# Patient Record
Sex: Female | Born: 1975 | Race: White | Hispanic: No | Marital: Single | Smoking: Former smoker
Health system: Southern US, Community
[De-identification: ages and names within clinical notes are randomized; demographics above are authoritative.]

## PROBLEM LIST (undated history)

## (undated) DIAGNOSIS — Z720 Tobacco use: Secondary | ICD-10-CM

## (undated) DIAGNOSIS — F32A Depression, unspecified: Secondary | ICD-10-CM

## (undated) DIAGNOSIS — J45909 Unspecified asthma, uncomplicated: Secondary | ICD-10-CM

## (undated) DIAGNOSIS — F329 Major depressive disorder, single episode, unspecified: Secondary | ICD-10-CM

## (undated) DIAGNOSIS — F419 Anxiety disorder, unspecified: Secondary | ICD-10-CM

## (undated) DIAGNOSIS — F1110 Opioid abuse, uncomplicated: Secondary | ICD-10-CM

## (undated) HISTORY — PX: TONSILLECTOMY: SUR1361

---

## 2005-01-26 ENCOUNTER — Emergency Department (HOSPITAL_COMMUNITY): Admission: EM | Admit: 2005-01-26 | Discharge: 2005-01-26 | Payer: Self-pay | Admitting: Emergency Medicine

## 2007-07-08 ENCOUNTER — Ambulatory Visit: Payer: Self-pay | Admitting: Internal Medicine

## 2007-07-10 ENCOUNTER — Ambulatory Visit: Payer: Self-pay | Admitting: *Deleted

## 2007-07-15 ENCOUNTER — Ambulatory Visit: Payer: Self-pay | Admitting: Internal Medicine

## 2007-09-16 ENCOUNTER — Encounter: Payer: Self-pay | Admitting: Family Medicine

## 2007-09-16 ENCOUNTER — Ambulatory Visit: Payer: Self-pay | Admitting: Family Medicine

## 2007-09-16 LAB — CONVERTED CEMR LAB
ALT: 238 units/L — ABNORMAL HIGH (ref 0–35)
BUN: 18 mg/dL (ref 6–23)
Basophils Absolute: 0.1 10*3/uL (ref 0.0–0.1)
Basophils Relative: 1 % (ref 0–1)
Benzodiazepines.: NEGATIVE
CO2: 23 meq/L (ref 19–32)
Calcium: 9.3 mg/dL (ref 8.4–10.5)
Cocaine Metabolites: NEGATIVE
Creatinine, Ser: 1.07 mg/dL (ref 0.40–1.20)
Creatinine,U: 148.5 mg/dL
Eosinophils Relative: 2 % (ref 0–5)
HCT: 42.5 % (ref 36.0–46.0)
Hemoglobin: 13.6 g/dL (ref 12.0–15.0)
MCHC: 32 g/dL (ref 30.0–36.0)
Monocytes Absolute: 0.7 10*3/uL (ref 0.1–1.0)
Monocytes Relative: 11 % (ref 3–12)
Opiate Screen, Urine: NEGATIVE
Phencyclidine (PCP): NEGATIVE
Propoxyphene: NEGATIVE
RBC: 4.57 M/uL (ref 3.87–5.11)
RDW: 14.6 % (ref 11.5–15.5)
TSH: 1.062 microintl units/mL (ref 0.350–5.50)
Total Bilirubin: 0.4 mg/dL (ref 0.3–1.2)

## 2007-09-30 ENCOUNTER — Ambulatory Visit: Payer: Self-pay | Admitting: Family Medicine

## 2007-09-30 LAB — CONVERTED CEMR LAB
ALT: 172 units/L — ABNORMAL HIGH (ref 0–35)
AST: 93 units/L — ABNORMAL HIGH (ref 0–37)
Albumin: 4.5 g/dL (ref 3.5–5.2)
Alkaline Phosphatase: 64 units/L (ref 39–117)
BUN: 16 mg/dL (ref 6–23)
Basophils Absolute: 0 10*3/uL (ref 0.0–0.1)
Basophils Relative: 1 % (ref 0–1)
Benzodiazepines.: POSITIVE — AB
Calcium: 9.3 mg/dL (ref 8.4–10.5)
Chloride: 106 meq/L (ref 96–112)
Creatinine,U: 202.6 mg/dL
Eosinophils Absolute: 0.2 10*3/uL (ref 0.0–0.7)
HCV Ab: REACTIVE — AB
HDL: 51 mg/dL (ref >39)
Hep A Total Ab: NEGATIVE
Hep B Core Total Ab: NEGATIVE
Hep B S Ab: POSITIVE — AB
Hepatitis B Surface Ag: NEGATIVE
LDL Cholesterol: 72 mg/dL (ref 0–99)
MCHC: 33.7 g/dL (ref 30.0–36.0)
MCV: 91 fL (ref 78.0–100.0)
Marijuana Metabolite: POSITIVE — AB
Methadone: NEGATIVE
Monocytes Relative: 9 % (ref 3–12)
Neutro Abs: 3.5 10*3/uL (ref 1.7–7.7)
Neutrophils Relative %: 58 % (ref 43–77)
Opiate Screen, Urine: NEGATIVE
Platelets: 228 10*3/uL (ref 150–400)
Potassium: 4.8 meq/L (ref 3.5–5.3)
Propoxyphene: POSITIVE — AB
RBC: 4.54 M/uL (ref 3.87–5.11)
RDW: 14.1 % (ref 11.5–15.5)
Sodium: 138 meq/L (ref 135–145)
TSH: 0.296 microintl units/mL — ABNORMAL LOW (ref 0.350–5.50)

## 2007-10-14 ENCOUNTER — Encounter: Payer: Self-pay | Admitting: Family Medicine

## 2007-10-14 ENCOUNTER — Ambulatory Visit: Payer: Self-pay | Admitting: Internal Medicine

## 2007-10-14 LAB — CONVERTED CEMR LAB: INR: 1.1 (ref 0.0–1.5)

## 2007-11-19 ENCOUNTER — Ambulatory Visit: Payer: Self-pay | Admitting: Internal Medicine

## 2012-10-12 ENCOUNTER — Ambulatory Visit: Payer: Medicaid Other | Admitting: Physical Therapy

## 2012-10-19 ENCOUNTER — Ambulatory Visit: Payer: Medicaid Other | Admitting: Physical Therapy

## 2012-10-20 ENCOUNTER — Ambulatory Visit: Payer: Medicaid Other | Attending: Internal Medicine | Admitting: Physical Therapy

## 2012-10-20 DIAGNOSIS — M542 Cervicalgia: Secondary | ICD-10-CM | POA: Insufficient documentation

## 2012-10-20 DIAGNOSIS — M5430 Sciatica, unspecified side: Secondary | ICD-10-CM | POA: Insufficient documentation

## 2012-10-20 DIAGNOSIS — IMO0001 Reserved for inherently not codable concepts without codable children: Secondary | ICD-10-CM | POA: Insufficient documentation

## 2012-11-06 ENCOUNTER — Emergency Department (HOSPITAL_COMMUNITY)
Admission: EM | Admit: 2012-11-06 | Discharge: 2012-11-06 | Disposition: A | Discharge status: Home / Self Care | Payer: Medicaid Other | Attending: Emergency Medicine | Admitting: Emergency Medicine

## 2012-11-06 ENCOUNTER — Encounter (HOSPITAL_COMMUNITY): Payer: Self-pay | Admitting: Emergency Medicine

## 2012-11-06 DIAGNOSIS — Y939 Activity, unspecified: Secondary | ICD-10-CM | POA: Insufficient documentation

## 2012-11-06 DIAGNOSIS — R450 Nervousness: Secondary | ICD-10-CM | POA: Insufficient documentation

## 2012-11-06 DIAGNOSIS — F411 Generalized anxiety disorder: Secondary | ICD-10-CM | POA: Insufficient documentation

## 2012-11-06 DIAGNOSIS — Z3202 Encounter for pregnancy test, result negative: Secondary | ICD-10-CM | POA: Insufficient documentation

## 2012-11-06 DIAGNOSIS — F17200 Nicotine dependence, unspecified, uncomplicated: Secondary | ICD-10-CM | POA: Insufficient documentation

## 2012-11-06 DIAGNOSIS — T401X4A Poisoning by heroin, undetermined, initial encounter: Secondary | ICD-10-CM | POA: Insufficient documentation

## 2012-11-06 DIAGNOSIS — R55 Syncope and collapse: Secondary | ICD-10-CM | POA: Insufficient documentation

## 2012-11-06 DIAGNOSIS — Y929 Unspecified place or not applicable: Secondary | ICD-10-CM | POA: Insufficient documentation

## 2012-11-06 LAB — COMPREHENSIVE METABOLIC PANEL
AST: 22 U/L (ref 0–37)
Albumin: 3.5 g/dL (ref 3.5–5.2)
Alkaline Phosphatase: 53 U/L (ref 39–117)
Chloride: 106 mEq/L (ref 96–112)
Potassium: 4.2 mEq/L (ref 3.5–5.1)
Sodium: 139 mEq/L (ref 135–145)
Total Bilirubin: 0.1 mg/dL — ABNORMAL LOW (ref 0.3–1.2)

## 2012-11-06 LAB — URINE MICROSCOPIC-ADD ON

## 2012-11-06 LAB — RAPID URINE DRUG SCREEN, HOSP PERFORMED
Barbiturates: NOT DETECTED
Benzodiazepines: POSITIVE — AB
Cocaine: NOT DETECTED
Opiates: POSITIVE — AB

## 2012-11-06 LAB — CBC WITH DIFFERENTIAL/PLATELET
Basophils Absolute: 0 10*3/uL (ref 0.0–0.1)
Basophils Relative: 0 % (ref 0–1)
Hemoglobin: 12.2 g/dL (ref 12.0–15.0)
MCHC: 33.2 g/dL (ref 30.0–36.0)
Neutro Abs: 6.2 10*3/uL (ref 1.7–7.7)
Neutrophils Relative %: 75 % (ref 43–77)
RDW: 13.1 % (ref 11.5–15.5)

## 2012-11-06 LAB — URINALYSIS, ROUTINE W REFLEX MICROSCOPIC
Glucose, UA: NEGATIVE mg/dL
Ketones, ur: NEGATIVE mg/dL
Protein, ur: NEGATIVE mg/dL

## 2012-11-06 LAB — PREGNANCY, URINE: Preg Test, Ur: NEGATIVE

## 2012-11-06 MED ORDER — IBUPROFEN 800 MG PO TABS
800.0000 mg | ORAL_TABLET | Freq: Once | ORAL | Status: AC
  Administered 2012-11-06: 800 mg via ORAL
  Filled 2012-11-06: qty 1

## 2012-11-06 MED ORDER — ONDANSETRON HCL 4 MG/2ML IJ SOLN
INTRAMUSCULAR | Status: AC
  Administered 2012-11-06: 4 mg via INTRAVENOUS
  Filled 2012-11-06: qty 2

## 2012-11-06 MED ORDER — ONDANSETRON HCL 4 MG/2ML IJ SOLN: 4.0000 mg | Freq: Once | INTRAMUSCULAR | Status: AC

## 2012-11-06 NOTE — ED Notes (Signed)
Per EMS, heroin OD, brother was at the house and started treatment with Narcan 1 mg IN-brother is an EMS worker-when EMS arrived, she was apneic when they arrived-was given Narcan 1 mg IV-pt's breathing returned to normal-per pt, did some heroin yesterday and "it knocked her out", thought it was laced with something-did smaller dose this am-states she has been clean for a few months, recent death in the family "made her use again"

## 2012-11-06 NOTE — Progress Notes (Signed)
Patient reports her pcp is Dr. Concepcion Elk.

## 2012-11-06 NOTE — ED Notes (Signed)
Per EMS and pt.  Pt began using heroin yesterday after being clean for several months.  Said she knocked herself out yesterday, used less today, but brother found pt unconscious.  EMS arrived gave narcan.  Pt awake and oriented upon arrival.  States she had recent death in family and took heroin to not feel the pain. Denies si/hi.

## 2012-11-06 NOTE — ED Notes (Signed)
UEA:[id number]<WJ> Expected date:<BR> Expected time:<BR> Means of arrival:<BR> Comments:<BR> EMS-heroin OD

## 2012-11-06 NOTE — ED Provider Notes (Signed)
History    CSN: [id number] Arrival date & time 11/06/12  4098  First MD Initiated Contact with Patient 11/06/12 [phone]     Chief Complaint  Patient presents with  . 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 Was given several doses of Narcan By EMS with return of spontaneous breathing and increased alertness. Pt is tearful in room and states that she was taking heroin recreationally and unintentionally overdosed. No SI, HI. Denies coingestants. Pt states she does not want drug rehab because she needs to care for her daughter. Spoke with pt's brother who was at scene. Stated he does not believe this was suicide attempt.  History reviewed. No pertinent past medical history. History reviewed. No pertinent past surgical history. History reviewed. No pertinent family history. History  Substance Use Topics  . Smoking status: Current Every Day Smoker  . Smokeless tobacco: Not on file  . Alcohol Use: Yes   OB History   Grav Para Term Preterm Abortions TAB SAB Ect Mult Living                 Review of Systems  Constitutional: Negative for fever and chills.  Respiratory: Negative for shortness of breath.   Cardiovascular: Negative for chest pain.  Gastrointestinal: Negative for nausea, vomiting and abdominal pain.  Musculoskeletal: Negative for myalgias and back pain.  Skin: Negative for rash and wound.  Neurological: Positive for syncope. Negative for dizziness, weakness, light-headedness, numbness and headaches.  Psychiatric/Behavioral: Negative for suicidal ideas. The patient is nervous/anxious.   All other systems reviewed and are negative.    Allergies  Review of patient's allergies indicates no known allergies.  Home Medications   Current Outpatient Rx  Name  Route   Sig  Dispense  Refill  . diclofenac (VOLTAREN) 75 MG EC tablet   Oral   Take 75 mg by mouth 2 (two) times daily as needed (for inflammation).         Marland Kitchen tiZANidine (ZANAFLEX) 4 MG tablet   Oral   Take 4 mg by mouth every 6 (six) hours as needed (for muscle spasms).          BP 105/74  Pulse 75  Temp(Src) 97.9 F (36.6 C) (Oral)  Resp 16  SpO2 96% Physical Exam  Nursing note and vitals reviewed. Constitutional: She is oriented to person, place, and time. She appears well-developed and well-nourished. No distress.  tearful  HENT:  Head: Normocephalic and atraumatic.  Mouth/Throat: Oropharynx is clear and moist.  Eyes: EOM are normal. Pupils are equal, round, and reactive to light.  Neck: Normal range of motion. Neck supple.  Cardiovascular: Normal rate and regular rhythm.   Pulmonary/Chest: Effort normal and breath sounds normal. No respiratory distress. She has no wheezes. She has no rales.  Abdominal: Soft. Bowel sounds are normal. She exhibits no distension and no mass. There is no tenderness. There is no rebound and no guarding.  Musculoskeletal: Normal range of motion. She exhibits no edema and no tenderness.  Neurological: She is alert and oriented to person, place, and time.  5/5 motor in all ext, sensation intact  Skin: Skin is warm and dry. No rash noted. No erythema.    ED Course  Procedures (including critical care time) Labs Reviewed  COMPREHENSIVE METABOLIC PANEL - Abnormal; Notable for the following:    Glucose, Bld  102 (*)    BUN 25 (*)    Total Bilirubin 0.1 (*)    GFR calc non Af Amer 72 (*)    GFR calc Af Amer 83 (*)    All other components within normal limits  URINALYSIS, ROUTINE W REFLEX MICROSCOPIC - Abnormal; Notable for the following:    Hgb urine dipstick TRACE (*)    All other components within normal limits  URINE RAPID DRUG SCREEN (HOSP PERFORMED) - Abnormal; Notable for the following:    Opiates POSITIVE (*)    Benzodiazepines POSITIVE  (*)    All other components within normal limits  SALICYLATE LEVEL - Abnormal; Notable for the following:    Salicylate Lvl <2.0 (*)    All other components within normal limits  CBC WITH DIFFERENTIAL  PREGNANCY, URINE  ETHANOL  ACETAMINOPHEN LEVEL  URINE MICROSCOPIC-ADD ON   No results found. 1. Heroin overdose, initial encounter      Date: 11/06/2012  Rate: 95  Rhythm: normal sinus rhythm  QRS Axis: normal  Intervals: normal  ST/T Wave abnormalities: normal  Conduction Disutrbances:none  Narrative Interpretation:   Old EKG Reviewed: none available   MDM  Will observe in ED to assure Narcan does not need to be redosed. Once medically cleared will have ACT see for outpatient resources Remains stable in ED after 3 hours of observation. Will d/c home.  Loren Racer, MD 11/06/12 1416

## 2013-03-25 ENCOUNTER — Other Ambulatory Visit: Payer: Self-pay | Admitting: Internal Medicine

## 2013-03-25 DIAGNOSIS — R55 Syncope and collapse: Secondary | ICD-10-CM

## 2013-03-29 ENCOUNTER — Other Ambulatory Visit: Payer: Medicaid Other

## 2013-04-03 ENCOUNTER — Emergency Department (HOSPITAL_COMMUNITY): Payer: Medicaid Other

## 2013-04-03 ENCOUNTER — Encounter (HOSPITAL_COMMUNITY): Payer: Self-pay | Admitting: Emergency Medicine

## 2013-04-03 ENCOUNTER — Inpatient Hospital Stay (HOSPITAL_COMMUNITY)
Admission: EM | Admit: 2013-04-03 | Discharge: 2013-04-05 | DRG: 193 | Disposition: A | Discharge status: Home / Self Care | Payer: Medicaid Other | Attending: Family Medicine | Admitting: Family Medicine

## 2013-04-03 DIAGNOSIS — J9601 Acute respiratory failure with hypoxia: Secondary | ICD-10-CM | POA: Diagnosis present

## 2013-04-03 DIAGNOSIS — D649 Anemia, unspecified: Secondary | ICD-10-CM | POA: Diagnosis present

## 2013-04-03 DIAGNOSIS — R55 Syncope and collapse: Secondary | ICD-10-CM | POA: Diagnosis present

## 2013-04-03 DIAGNOSIS — Z825 Family history of asthma and other chronic lower respiratory diseases: Secondary | ICD-10-CM

## 2013-04-03 DIAGNOSIS — F410 Panic disorder [episodic paroxysmal anxiety] without agoraphobia: Secondary | ICD-10-CM | POA: Diagnosis present

## 2013-04-03 DIAGNOSIS — J45909 Unspecified asthma, uncomplicated: Secondary | ICD-10-CM | POA: Diagnosis present

## 2013-04-03 DIAGNOSIS — F319 Bipolar disorder, unspecified: Secondary | ICD-10-CM | POA: Diagnosis present

## 2013-04-03 DIAGNOSIS — B1920 Unspecified viral hepatitis C without hepatic coma: Secondary | ICD-10-CM

## 2013-04-03 DIAGNOSIS — F411 Generalized anxiety disorder: Secondary | ICD-10-CM | POA: Diagnosis present

## 2013-04-03 DIAGNOSIS — J9600 Acute respiratory failure, unspecified whether with hypoxia or hypercapnia: Secondary | ICD-10-CM | POA: Diagnosis present

## 2013-04-03 DIAGNOSIS — R0902 Hypoxemia: Secondary | ICD-10-CM

## 2013-04-03 DIAGNOSIS — J189 Pneumonia, unspecified organism: Principal | ICD-10-CM | POA: Diagnosis present

## 2013-04-03 DIAGNOSIS — F1910 Other psychoactive substance abuse, uncomplicated: Secondary | ICD-10-CM | POA: Diagnosis present

## 2013-04-03 DIAGNOSIS — Z79899 Other long term (current) drug therapy: Secondary | ICD-10-CM

## 2013-04-03 DIAGNOSIS — F17200 Nicotine dependence, unspecified, uncomplicated: Secondary | ICD-10-CM | POA: Diagnosis present

## 2013-04-03 DIAGNOSIS — R4182 Altered mental status, unspecified: Secondary | ICD-10-CM | POA: Diagnosis present

## 2013-04-03 HISTORY — DX: Anxiety disorder, unspecified: F41.9

## 2013-04-03 HISTORY — DX: Unspecified asthma, uncomplicated: J45.909

## 2013-04-03 HISTORY — DX: Depression, unspecified: F32.A

## 2013-04-03 HISTORY — DX: Tobacco use: Z72.0

## 2013-04-03 HISTORY — DX: Opioid abuse, uncomplicated: F11.10

## 2013-04-03 HISTORY — DX: Major depressive disorder, single episode, unspecified: F32.9

## 2013-04-03 LAB — CBC WITH DIFFERENTIAL/PLATELET
Basophils Absolute: 0.1 10*3/uL (ref 0.0–0.1)
Basophils Relative: 1 % (ref 0–1)
Eosinophils Absolute: 0.5 10*3/uL (ref 0.0–0.7)
Eosinophils Relative: 7 % — ABNORMAL HIGH (ref 0–5)
HCT: 35.7 % — ABNORMAL LOW (ref 36.0–46.0)
MCH: 29.1 pg (ref 26.0–34.0)
MCHC: 33.3 g/dL (ref 30.0–36.0)
Monocytes Absolute: 0.6 10*3/uL (ref 0.1–1.0)
Monocytes Relative: 9 % (ref 3–12)
Neutro Abs: 4.2 10*3/uL (ref 1.7–7.7)
Neutrophils Relative %: 64 % (ref 43–77)
RDW: 14 % (ref 11.5–15.5)

## 2013-04-03 LAB — POCT I-STAT TROPONIN I: Troponin i, poc: 0.01 ng/mL (ref 0.00–0.08)

## 2013-04-03 LAB — RAPID URINE DRUG SCREEN, HOSP PERFORMED
Amphetamines: NOT DETECTED
Barbiturates: NOT DETECTED
Tetrahydrocannabinol: NOT DETECTED

## 2013-04-03 LAB — HEPATIC FUNCTION PANEL
ALT: 32 U/L (ref 0–35)
AST: 26 U/L (ref 0–37)
Albumin: 4 g/dL (ref 3.5–5.2)
Alkaline Phosphatase: 59 U/L (ref 39–117)
Bilirubin, Direct: <0.1 mg/dL (ref 0.0–0.3)
Total Bilirubin: 0.2 mg/dL — ABNORMAL LOW (ref 0.3–1.2)

## 2013-04-03 LAB — BLOOD GAS, ARTERIAL
Drawn by: 232811
O2 Content: 3 L/min
O2 Saturation: 93.7 %
Patient temperature: 98.6
pO2, Arterial: 74.7 mmHg — ABNORMAL LOW (ref 80.0–100.0)

## 2013-04-03 LAB — ACETAMINOPHEN LEVEL: Acetaminophen (Tylenol), Serum: <15 ug/mL (ref 10–30)

## 2013-04-03 LAB — BASIC METABOLIC PANEL
BUN: 24 mg/dL — ABNORMAL HIGH (ref 6–23)
CO2: 26 mEq/L (ref 19–32)
Chloride: 102 mEq/L (ref 96–112)
Glucose, Bld: 84 mg/dL (ref 70–99)
Potassium: 4.4 mEq/L (ref 3.5–5.1)

## 2013-04-03 LAB — CARBOXYHEMOGLOBIN: O2 Saturation: 94.3 %

## 2013-04-03 LAB — SALICYLATE LEVEL: Salicylate Lvl: <2 mg/dL — ABNORMAL LOW (ref 2.8–20.0)

## 2013-04-03 MED ORDER — ALBUTEROL SULFATE (5 MG/ML) 0.5% IN NEBU
5.0000 mg | INHALATION_SOLUTION | Freq: Once | RESPIRATORY_TRACT | Status: AC
  Administered 2013-04-03: 5 mg via RESPIRATORY_TRACT
  Filled 2013-04-03: qty 1

## 2013-04-03 MED ORDER — IOHEXOL 350 MG/ML SOLN
100.0000 mL | Freq: Once | INTRAVENOUS | Status: AC | PRN
  Administered 2013-04-03: 100 mL via INTRAVENOUS

## 2013-04-03 MED ORDER — LEVOFLOXACIN IN D5W 750 MG/150ML IV SOLN
750.0000 mg | Freq: Once | INTRAVENOUS | Status: AC
  Administered 2013-04-04: 750 mg via INTRAVENOUS
  Filled 2013-04-03: qty 150

## 2013-04-03 MED ORDER — SODIUM CHLORIDE 0.9 % IV BOLUS (SEPSIS)
1000.0000 mL | Freq: Once | INTRAVENOUS | Status: AC
  Administered 2013-04-03: 1000 mL via INTRAVENOUS

## 2013-04-03 MED ORDER — NALOXONE HCL 0.4 MG/ML IJ SOLN
0.4000 mg | Freq: Once | INTRAMUSCULAR | Status: AC
  Administered 2013-04-03: 0.4 mg via INTRAVENOUS
  Filled 2013-04-03: qty 1

## 2013-04-03 NOTE — ED Notes (Signed)
Dr. Criss Alvine at bedside evaluating pt-O2 sat decreased to 83%-pt is lethargic but conversing WNL's, speech slow-eyes are half closed.  Mother states pt has been like this all day-stimulated pt-O2 applied 5l/[place] with sat increased to 96% and after pt stimulated.  Dr. Criss Alvine aware.  Primary RN, Irving Burton aware.

## 2013-04-03 NOTE — ED Notes (Signed)
Patient with hx of IV drug use, but states that she did not use today Patient recently changed psych meds and since then, she has been more sleepy than usual Patient states that her PCP is aware of low O2 sat and that this is normal for her MD at bedside Patient able to speak in full, complete sentences--able to stay awake during conversation ED tech at bedside to obtain EKG RT at bedside to obtain labs, per MD order

## 2013-04-03 NOTE — ED Notes (Signed)
MD at bedside. 

## 2013-04-03 NOTE — ED Notes (Signed)
Lab unable to run CBC off of previously collected specimen Patient refusing to have labs drawn MD at bedside

## 2013-04-03 NOTE — ED Notes (Signed)
Patient now agrees to have CBC drawn Phlebotomy at bedside Patient more alert, awake after admin of Narcan Patient remains on cardiac monitor and 3L [place] O2 sat 94-96 % on 3L

## 2013-04-03 NOTE — ED Notes (Addendum)
Patient transported to CT dept via stretcher Patient awake, alert and in NAD upon leaving ED for imaging

## 2013-04-03 NOTE — ED Notes (Signed)
She c/o recent insomnia; and weakness "feels like I'm gonna pass out".  She has recently ceased taking Neurontin, and is currently on Zoloft 200mg /day.  She states that earlier today she found herself on the floor "picking up things; and I can't remember how I got there".  Her mom is a Engineer, civil (consulting); and has been checking her with a home pulse oximeter and has found her spo2 to be quite low (70's) at times.  Pt. Is drowsy, but oriented x 4 and in no distress.  She also mentions having some recent discomfort in her chest, but has none now.

## 2013-04-03 NOTE — ED Notes (Signed)
Narcan given, see MAR Patient remains on cardiac monitor with q 15 min BP Family at bedside Side rails up, call bell in reach

## 2013-04-03 NOTE — ED Provider Notes (Signed)
CSN: [id number]     Arrival date & time 04/03/13  1809 History   First MD Initiated Contact with Patient 04/03/13 1945     Chief Complaint  Patient presents with  . Weakness   (Consider location/radiation/quality/duration/timing/severity/associated sxs/prior Treatment) HPI Comments: 37 year old female presents with multiple complaints. She presents today because she had a episode of confusion was transient. She states that she was taking a shower and was brushing her teeth. The next thing she remembers she is picking up here pieces on the floor and thought it was in the morning. At this point it was actually 4 PM in the afternoon. She states she's not falling or passing out. She was never lying on the ground. She states that she's also been treated for a bronchitis over the past couple weeks. Her mom is an Charity fundraiser and states she's been checking her pulse ox at home. Multiple times it sounded down in the low 80s and has tried some of her brothers albuterol which seems to bring up to the 90s. Patient does not complain of specific shortness of breath but states sometimes she "forgets to breathe". She's also had a headache for the past 2 weeks. She states that her PCP knows about these low saturations and ordered an outpatient chest x-ray which the patient is still not gone to get. Patient does admit to IV drug abuse. She states that 3 days ago she injected oxycodone states she has not used any in the last couple days. She states she's been getting hypoxic even on days she is not using drugs.   History reviewed. No pertinent past medical history. History reviewed. No pertinent past surgical history. History reviewed. No pertinent family history. History  Substance Use Topics  . Smoking status: Current Every Day Smoker  . Smokeless tobacco: Not on file  . Alcohol Use: Yes   OB History   Grav Para Term Preterm Abortions TAB SAB Ect Mult Living                 Review of Systems  Constitutional:  Positive for fatigue. Negative for fever.  Respiratory: Positive for cough and shortness of breath.   Cardiovascular: Negative for chest pain and leg swelling.  Gastrointestinal: Negative for vomiting.  Neurological: Positive for weakness and headaches (for 2 weeks).  Psychiatric/Behavioral: Positive for confusion.  All other systems reviewed and are negative.    Allergies  Review of patient's allergies indicates no known allergies.  Home Medications   Current Outpatient Rx  Name  Route  Sig  Dispense  Refill  . albuterol (PROVENTIL HFA;VENTOLIN HFA) 108 (90 BASE) MCG/ACT inhaler   Inhalation   Inhale 2 puffs into the lungs every 6 (six) hours as needed for wheezing or shortness of breath.         . diphenhydrAMINE (BENADRYL) 25 MG tablet   Oral   Take 25 mg by mouth every 6 (six) hours as needed.         . sertraline (ZOLOFT) 100 MG tablet   Oral   Take 100 mg by mouth daily.         Marland Kitchen tiZANidine (ZANAFLEX) 4 MG tablet   Oral   Take 4 mg by mouth every 6 (six) hours as needed (for muscle spasms).         . zolpidem (AMBIEN) 10 MG tablet   Oral   Take 10 mg by mouth at bedtime as needed for sleep.  BP 106/69  Pulse 82  Temp(Src) 98 F (36.7 C) (Oral)  Resp 12  SpO2 97%  LMP 03/24/2013 Physical Exam  Nursing note and vitals reviewed. Constitutional: She is oriented to person, place, and time. She appears well-developed and well-nourished.  HENT:  Head: Normocephalic and atraumatic.  Right Ear: External ear normal.  Left Ear: External ear normal.  Nose: Nose normal.  Eyes: Right eye exhibits no discharge. Left eye exhibits no discharge.  Cardiovascular: Normal rate, regular rhythm and normal heart sounds.   Pulmonary/Chest: Effort normal. She has wheezes (scattered).  Abdominal: Soft. There is no tenderness.  Neurological: She is alert and oriented to person, place, and time.  Sleepy but maintains conversation and stays awake while sleeping,  then nods off  Skin: Skin is warm and dry.    ED Course  Procedures (including critical care time) Labs Review Labs Reviewed  BLOOD GAS, ARTERIAL - Abnormal; Notable for the following:    pH, Arterial 7.307 (*)    pCO2 arterial 51.7 (*)    pO2, Arterial 74.7 (*)    Bicarbonate 25.1 (*)    All other components within normal limits  BASIC METABOLIC PANEL - Abnormal; Notable for the following:    BUN 24 (*)    Creatinine, Ser 1.18 (*)    GFR calc non Af Amer 58 (*)    GFR calc Af Amer 67 (*)    All other components within normal limits  HEPATIC FUNCTION PANEL - Abnormal; Notable for the following:    Total Bilirubin 0.2 (*)    All other components within normal limits  URINE RAPID DRUG SCREEN (HOSP PERFORMED) - Abnormal; Notable for the following:    Opiates POSITIVE (*)    Benzodiazepines POSITIVE (*)    All other components within normal limits  SALICYLATE LEVEL - Abnormal; Notable for the following:    Salicylate Lvl <2.0 (*)    All other components within normal limits  CBC WITH DIFFERENTIAL - Abnormal; Notable for the following:    Hemoglobin 11.9 (*)    HCT 35.7 (*)    Eosinophils Relative 7 (*)    All other components within normal limits  CULTURE, BLOOD (ROUTINE X 2)  CULTURE, BLOOD (ROUTINE X 2)  CARBOXYHEMOGLOBIN  AMMONIA  ETHANOL  ACETAMINOPHEN LEVEL  CBC WITH DIFFERENTIAL  POCT I-STAT TROPONIN I   Imaging Review Dg Chest 2 View  04/03/2013   CLINICAL DATA:  Hypoxia and weakness.  Tremors.  History of smoking.  EXAM: CHEST  2 VIEW  COMPARISON:  None.  FINDINGS: The lungs are well-aerated and clear. There is no evidence of focal opacification, pleural effusion or pneumothorax.  The heart is normal in size; the mediastinal contour is within normal limits. No acute osseous abnormalities are seen.  IMPRESSION: No acute cardiopulmonary process seen.   Electronically Signed   By: Roanna Raider M.D.   On: 04/03/2013 21:13   Ct Head Wo Contrast  04/03/2013    CLINICAL DATA:  Possible overdose. Posterior headache and weakness. Prior fall. Multiple episodes of syncope.  EXAM: CT HEAD WITHOUT CONTRAST  TECHNIQUE: Contiguous axial images were obtained from the base of the skull through the vertex without intravenous contrast.  COMPARISON:  None.  FINDINGS: There is no evidence of acute infarction, mass lesion, or intra- or extra-axial hemorrhage on CT.  The posterior fossa, including the cerebellum, brainstem and fourth ventricle, is within normal limits. The third and lateral ventricles, and basal ganglia are unremarkable in appearance. The cerebral  hemispheres are symmetric in appearance, with normal gray-white differentiation. No mass effect or midline shift is seen.  There is no evidence of fracture; visualized osseous structures are unremarkable in appearance. The visualized portions of the orbits are within normal limits. The paranasal sinuses and mastoid air cells are well-aerated. No significant soft tissue abnormalities are seen.  IMPRESSION: Unremarkable noncontrast CT of the head.   Electronically Signed   By: Roanna Raider M.D.   On: 04/03/2013 21:14   Ct Angio Chest Pe W/cm &/or Wo Cm  04/03/2013   CLINICAL DATA:  Shortness of breath; hypoxia. Intermittent chest pain.  EXAM: CT ANGIOGRAPHY CHEST WITH CONTRAST  TECHNIQUE: Multidetector CT imaging of the chest was performed using the standard protocol during bolus administration of intravenous contrast. Multiplanar CT image reconstructions including MIPs were obtained to evaluate the vascular anatomy.  CONTRAST:  OMNIPAQUE IOHEXOL 350 MG/ML SOLN  COMPARISON:  Chest radiograph performed earlier today at 08:55 p.m.  FINDINGS: There is no evidence of pulmonary embolus.  Focal airspace opacities within both lungs, with partial sparing of the lung apices, raise concern for mild multifocal pneumonia. There is no evidence of pleural effusion or pneumothorax. No masses are identified; no abnormal focal contrast  enhancement is seen.  The mediastinum is unremarkable in appearance. There may be a mildly prominent subcarinal node, though this is not well assessed. No obvious mediastinal lymphadenopathy is seen. No pericardial effusion is identified. The great vessels are grossly unremarkable in appearance. No axillary lymphadenopathy is seen. The visualized portions of the thyroid gland are unremarkable in appearance.  The visualized portions of the liver and spleen are unremarkable. Mild degenerative change is noted at the lower cervical spine.  No acute osseous abnormalities are seen.  Review of the MIP images confirms the above findings.  IMPRESSION: 1. No evidence of pulmonary embolus. 2. Focal bilateral airspace opacities, with partial sparing of the lung apices, concerning for mild multifocal pneumonia. 3. Question of mildly prominent subcarinal node.   Electronically Signed   By: Roanna Raider M.D.   On: 04/03/2013 23:04    EKG Interpretation    Date/Time:  Saturday April 03 2013 20:16:58 EST Ventricular Rate:  81 PR Interval:  127 QRS Duration: 81 QT Interval:  354 QTC Calculation: 411 R Axis:   62 Text Interpretation:  Sinus rhythm Baseline wander in lead(s) V6 No significant change since last tracing Confirmed by Cayleigh Paull  MD, Taquanna Borras ([id number]) on 04/03/2013 11:32:18 PM            MDM   1. Pneumonia   2. Hypoxia    Patient initially hypoxic to high 70s, responsive to O2. Is GCS 14, given small dose of narcan with some effect, patient remains more alert now but still hypoxic. Normal neuro exam. CTA shows no PE but does show multifocal PNA. Recently finished azithromycin and is otherwise CAP. Will get blood cultures and treat with Levaquin and admit for IV abx, oxygen and supportive care.     Audree Camel, MD 04/03/13 [phone]

## 2013-04-03 NOTE — ED Notes (Signed)
Dr. Goldston at bedside.  

## 2013-04-03 NOTE — ED Notes (Signed)
Patient asking for something to drink Advised patient that until results of recent CT return, that she needs to be remain NPO--v/u Patient appears in NAD Call bell in reach

## 2013-04-04 ENCOUNTER — Encounter (HOSPITAL_COMMUNITY): Payer: Self-pay | Admitting: Internal Medicine

## 2013-04-04 DIAGNOSIS — J45909 Unspecified asthma, uncomplicated: Secondary | ICD-10-CM | POA: Diagnosis present

## 2013-04-04 DIAGNOSIS — J9600 Acute respiratory failure, unspecified whether with hypoxia or hypercapnia: Secondary | ICD-10-CM

## 2013-04-04 DIAGNOSIS — R55 Syncope and collapse: Secondary | ICD-10-CM | POA: Diagnosis present

## 2013-04-04 DIAGNOSIS — I517 Cardiomegaly: Secondary | ICD-10-CM

## 2013-04-04 DIAGNOSIS — J9601 Acute respiratory failure with hypoxia: Secondary | ICD-10-CM | POA: Diagnosis present

## 2013-04-04 DIAGNOSIS — J189 Pneumonia, unspecified organism: Secondary | ICD-10-CM | POA: Diagnosis present

## 2013-04-04 DIAGNOSIS — R4182 Altered mental status, unspecified: Secondary | ICD-10-CM | POA: Diagnosis present

## 2013-04-04 DIAGNOSIS — F1910 Other psychoactive substance abuse, uncomplicated: Secondary | ICD-10-CM | POA: Diagnosis present

## 2013-04-04 LAB — HIV ANTIBODY (ROUTINE TESTING W REFLEX): HIV: NONREACTIVE

## 2013-04-04 LAB — HEPATITIS PANEL, ACUTE
Hep A IgM: NONREACTIVE
Hep B C IgM: NONREACTIVE
Hepatitis B Surface Ag: NEGATIVE

## 2013-04-04 MED ORDER — ACETAMINOPHEN 325 MG PO TABS: 650.0000 mg | ORAL_TABLET | Freq: Four times a day (QID) | ORAL | Status: DC | PRN

## 2013-04-04 MED ORDER — SODIUM CHLORIDE 0.9 % IV SOLN
INTRAVENOUS | Status: AC
  Administered 2013-04-04: 02:00:00 via INTRAVENOUS

## 2013-04-04 MED ORDER — ONDANSETRON HCL 4 MG PO TABS: 4.0000 mg | ORAL_TABLET | Freq: Four times a day (QID) | ORAL | Status: DC | PRN

## 2013-04-04 MED ORDER — ALBUTEROL SULFATE (5 MG/ML) 0.5% IN NEBU: 2.5000 mg | INHALATION_SOLUTION | RESPIRATORY_TRACT | Status: DC | PRN

## 2013-04-04 MED ORDER — ENOXAPARIN SODIUM 40 MG/0.4ML ~~LOC~~ SOLN
40.0000 mg | SUBCUTANEOUS | Status: DC
  Administered 2013-04-04 – 2013-04-05 (×2): 40 mg via SUBCUTANEOUS
  Filled 2013-04-04 (×2): qty 0.4

## 2013-04-04 MED ORDER — ALBUTEROL SULFATE (5 MG/ML) 0.5% IN NEBU
2.5000 mg | INHALATION_SOLUTION | Freq: Four times a day (QID) | RESPIRATORY_TRACT | Status: DC
  Administered 2013-04-04 (×2): 2.5 mg via RESPIRATORY_TRACT
  Filled 2013-04-04 (×2): qty 0.5

## 2013-04-04 MED ORDER — IPRATROPIUM BROMIDE 0.02 % IN SOLN: 0.5000 mg | RESPIRATORY_TRACT | Status: DC | PRN

## 2013-04-04 MED ORDER — TIZANIDINE HCL 4 MG PO TABS
4.0000 mg | ORAL_TABLET | Freq: Four times a day (QID) | ORAL | Status: DC | PRN
  Filled 2013-04-04: qty 1

## 2013-04-04 MED ORDER — ACETAMINOPHEN 650 MG RE SUPP: 650.0000 mg | Freq: Four times a day (QID) | RECTAL | Status: DC | PRN

## 2013-04-04 MED ORDER — SODIUM CHLORIDE 0.9 % IJ SOLN
3.0000 mL | Freq: Two times a day (BID) | INTRAMUSCULAR | Status: DC
  Administered 2013-04-04 – 2013-04-05 (×2): 3 mL via INTRAVENOUS

## 2013-04-04 MED ORDER — ONDANSETRON HCL 4 MG/2ML IJ SOLN: 4.0000 mg | Freq: Four times a day (QID) | INTRAMUSCULAR | Status: DC | PRN

## 2013-04-04 MED ORDER — METHYLPREDNISOLONE SODIUM SUCC 125 MG IJ SOLR
60.0000 mg | Freq: Two times a day (BID) | INTRAMUSCULAR | Status: DC
  Administered 2013-04-04 – 2013-04-05 (×4): 60 mg via INTRAVENOUS
  Filled 2013-04-04 (×5): qty 0.96

## 2013-04-04 MED ORDER — LEVOFLOXACIN IN D5W 750 MG/150ML IV SOLN
750.0000 mg | INTRAVENOUS | Status: DC
  Administered 2013-04-04: 750 mg via INTRAVENOUS
  Filled 2013-04-04 (×2): qty 150

## 2013-04-04 MED ORDER — ZOLPIDEM TARTRATE 5 MG PO TABS
5.0000 mg | ORAL_TABLET | Freq: Every evening | ORAL | Status: DC | PRN
  Administered 2013-04-04: 5 mg via ORAL
  Filled 2013-04-04: qty 1

## 2013-04-04 MED ORDER — IPRATROPIUM BROMIDE 0.02 % IN SOLN
0.5000 mg | Freq: Four times a day (QID) | RESPIRATORY_TRACT | Status: DC
  Administered 2013-04-04 (×2): 0.5 mg via RESPIRATORY_TRACT
  Filled 2013-04-04 (×2): qty 2.5

## 2013-04-04 MED ORDER — GUAIFENESIN-DM 100-10 MG/5ML PO SYRP: 5.0000 mL | ORAL_SOLUTION | ORAL | Status: DC | PRN

## 2013-04-04 MED ORDER — GUAIFENESIN ER 600 MG PO TB12
600.0000 mg | ORAL_TABLET | Freq: Two times a day (BID) | ORAL | Status: DC
  Administered 2013-04-04 – 2013-04-05 (×4): 600 mg via ORAL
  Filled 2013-04-04 (×5): qty 1

## 2013-04-04 NOTE — H&P (Signed)
[facility]  History and Physical  Christie Ruiz UVO:[id number] DOB: 1975-06-25 DOA: 04/03/2013  Referring physician: EDP PCP: Dorrene German, MD  Outpatient Specialists:  1. Psychiatry: At [facility]  Chief Complaint: Cough, difficulty breathing, hypoxia and altered mental status  HPI: Christie Ruiz is a 37 y.o. female with history of anxiety, panic attacks, depression/bipolar disorder, polysubstance abuse-tobacco, heroine, IV oxycodone and benzodiazepines, presented to the ED with above complaints. Patient and mother (after patient's consent) at bedside provided history. Patient apparently has had nonproductive cough, dyspnea and wheezing for almost 4 weeks. She was seen by her PCP and treated for asthmatic bronchitis with Z-Pak, steroids and inhalers. She completed the antibiotics but did not complete the steroid course. She felt better for a few days but over the last 1 week, as noted worsening nonproductive cough, intermittent worsening dyspnea and wheezing. Mother is a [employer] fundraiser and home O2 checks have shown desaturation in the 80s. She had one episode of cough with "pink frothy sputum" according to mom. Over the last week, patient also has had intermittent confusion, somnolence and patient denies that she was on drugs at this time which the mom says that she's not sure of. They give a history of syncopal episode, fall approximately 2 weeks ago. Today while she was in the shower, patient does not remember subsequent events and was found picking up hair bows from the floor. She denies fall or passing out today. She states that her last IV oxycodone use was 3 days ago. In the ED, patient was initially hypoxic in the high 70s, BUN 24, creatinine 1.18, hemoglobin 11.9, chest is grade without acute findings, CT head without acute findings but CTA chest showed no PE but showed airspace opacities concerning for multifocal pneumonia. UDS positive for opiates and benzodiazepines. ABG showed pH 7.307,  PCO2 52, PO2 75, bicarbonate 25 and oxygen saturation 93.7% on oxygen. EDP did give her a small dose of Narcan with some effect and patient has been alert since. Her mother states that her mental status is back to normal. Hospitalist admission requested.  Review of Systems: All systems reviewed and apart from history of presenting illness, are negative. Patient denies hallucinations, delusions, suicidal or homicidal ideations.  Past Medical History  Diagnosis Date  . Heroin abuse   . Anxiety and depression   . Tobacco abuse disorder   . Asthmatic bronchitis    History reviewed. No pertinent past surgical history. Social History:  reports that she has been smoking.  She does not have any smokeless tobacco history on file. She reports that she drinks alcohol. She reports that she uses illicit drugs. Single. Has a 80-year-old daughter. Lives with parents. Independent of activities of daily living. States that she smokes one or 2 cigarettes per week. She also indicates he does not drink heavily and may drink one or 2 beers per week. She does confirm abusing street IV oxycodone-last time was 3 days ago.  No Known Allergies  History reviewed. No pertinent family history. brother has asthma. Father has high blood pressure.   Prior to Admission medications   Medication Sig Start Date End Date Taking? Authorizing Provider  albuterol (PROVENTIL HFA;VENTOLIN HFA) 108 (90 BASE) MCG/ACT inhaler Inhale 2 puffs into the lungs every 6 (six) hours as needed for wheezing or shortness of breath.   Yes Historical Provider, MD  diphenhydrAMINE (BENADRYL) 25 MG tablet Take 25 mg by mouth every 6 (six) hours as needed.   Yes Historical Provider, MD  sertraline (ZOLOFT)  100 MG tablet Take 100 mg by mouth daily.   Yes Historical Provider, MD  tiZANidine (ZANAFLEX) 4 MG tablet Take 4 mg by mouth every 6 (six) hours as needed (for muscle spasms).   Yes Historical Provider, MD  zolpidem (AMBIEN) 10 MG tablet Take 10 mg  by mouth at bedtime as needed for sleep.   Yes Historical Provider, MD   Physical Exam: Filed Vitals:   04/03/13 2254 04/03/13 2258 04/03/13 2315 04/04/13 0006  BP: 110/61 110/61    Pulse: 87  88 91  Temp: 98.7 F (37.1 C)     TempSrc: Oral     Resp: 16  14 24   SpO2: 92%  94% 94%     General exam: Moderately built and nourished female patient, lying comfortably supine on the gurney in no obvious distress.  Head, eyes and ENT: Nontraumatic and normocephalic. Pupils equally reacting to light and accommodation. Oral mucosa with borderline hydration.  Neck: Supple. No JVD, carotid bruit or thyromegaly.  Lymphatics: No lymphadenopathy.  Respiratory system: Reduced breath sounds bilaterally with scattered bilateral few expiratory wheezing and left upper anterior chest crackles. No increased work of breathing. Able to speak in full sentences.  Cardiovascular system: S1 and S2 heard, RRR. No JVD, murmurs, gallops, clicks or pedal edema.  Gastrointestinal system: Abdomen is nondistended, soft and nontender. Normal bowel sounds heard. No organomegaly or masses appreciated.  Central nervous system: Alert and oriented. No focal neurological deficits.  Extremities: Symmetric 5 x 5 power. Peripheral pulses symmetrically felt. IV track marks left hand.  Skin: No rashes or acute findings.  Musculoskeletal system: Negative exam.  Psychiatry: Pleasant and cooperative.   Labs on Admission:  Basic Metabolic Panel:  Recent Labs Lab 04/03/13 2020  NA 137  K 4.4  CL 102  CO2 26  GLUCOSE 84  BUN 24*  CREATININE 1.18*  CALCIUM 9.1   Liver Function Tests:  Recent Labs Lab 04/03/13 2020  AST 26  ALT 32  ALKPHOS 59  BILITOT 0.2*  PROT 7.8  ALBUMIN 4.0   No results found for this basename: LIPASE, AMYLASE,  in the last 168 hours  Recent Labs Lab 04/03/13 2020  AMMONIA 36   CBC:  Recent Labs Lab 04/03/13 2210  WBC 6.6  NEUTROABS 4.2  HGB 11.9*  HCT 35.7*  MCV 87.3   PLT 177   Cardiac Enzymes: No results found for this basename: CKTOTAL, CKMB, CKMBINDEX, TROPONINI,  in the last 168 hours  BNP (last 3 results) No results found for this basename: PROBNP,  in the last 8760 hours CBG: No results found for this basename: GLUCAP,  in the last 168 hours  Radiological Exams on Admission: Dg Chest 2 View  04/03/2013   CLINICAL DATA:  Hypoxia and weakness.  Tremors.  History of smoking.  EXAM: CHEST  2 VIEW  COMPARISON:  None.  FINDINGS: The lungs are well-aerated and clear. There is no evidence of focal opacification, pleural effusion or pneumothorax.  The heart is normal in size; the mediastinal contour is within normal limits. No acute osseous abnormalities are seen.  IMPRESSION: No acute cardiopulmonary process seen.   Electronically Signed   By: Roanna Raider M.D.   On: 04/03/2013 21:13   Ct Head Wo Contrast  04/03/2013   CLINICAL DATA:  Possible overdose. Posterior headache and weakness. Prior fall. Multiple episodes of syncope.  EXAM: CT HEAD WITHOUT CONTRAST  TECHNIQUE: Contiguous axial images were obtained from the base of the skull through the vertex  without intravenous contrast.  COMPARISON:  None.  FINDINGS: There is no evidence of acute infarction, mass lesion, or intra- or extra-axial hemorrhage on CT.  The posterior fossa, including the cerebellum, brainstem and fourth ventricle, is within normal limits. The third and lateral ventricles, and basal ganglia are unremarkable in appearance. The cerebral hemispheres are symmetric in appearance, with normal gray-white differentiation. No mass effect or midline shift is seen.  There is no evidence of fracture; visualized osseous structures are unremarkable in appearance. The visualized portions of the orbits are within normal limits. The paranasal sinuses and mastoid air cells are well-aerated. No significant soft tissue abnormalities are seen.  IMPRESSION: Unremarkable noncontrast CT of the head.    Electronically Signed   By: Roanna Raider M.D.   On: 04/03/2013 21:14   Ct Angio Chest Pe W/cm &/or Wo Cm  04/03/2013   CLINICAL DATA:  Shortness of breath; hypoxia. Intermittent chest pain.  EXAM: CT ANGIOGRAPHY CHEST WITH CONTRAST  TECHNIQUE: Multidetector CT imaging of the chest was performed using the standard protocol during bolus administration of intravenous contrast. Multiplanar CT image reconstructions including MIPs were obtained to evaluate the vascular anatomy.  CONTRAST:  OMNIPAQUE IOHEXOL 350 MG/ML SOLN  COMPARISON:  Chest radiograph performed earlier today at 08:55 p.m.  FINDINGS: There is no evidence of pulmonary embolus.  Focal airspace opacities within both lungs, with partial sparing of the Ruiz apices, raise concern for mild multifocal pneumonia. There is no evidence of pleural effusion or pneumothorax. No masses are identified; no abnormal focal contrast enhancement is seen.  The mediastinum is unremarkable in appearance. There may be a mildly prominent subcarinal node, though this is not well assessed. No obvious mediastinal lymphadenopathy is seen. No pericardial effusion is identified. The great vessels are grossly unremarkable in appearance. No axillary lymphadenopathy is seen. The visualized portions of the thyroid gland are unremarkable in appearance.  The visualized portions of the liver and spleen are unremarkable. Mild degenerative change is noted at the lower cervical spine.  No acute osseous abnormalities are seen.  Review of the MIP images confirms the above findings.  IMPRESSION: 1. No evidence of pulmonary embolus. 2. Focal bilateral airspace opacities, with partial sparing of the Ruiz apices, concerning for mild multifocal pneumonia. 3. Question of mildly prominent subcarinal node.   Electronically Signed   By: Roanna Raider M.D.   On: 04/03/2013 23:04    EKG: Independently reviewed. Sinus rhythm at 81 beats per minute, normal axis, without acute changes. QTC 411  msecs.  Assessment/Plan Principal Problem:   Pneumonia Active Problems:   Altered mental status   Asthmatic bronchitis   Polysubstance abuse - heroin, tobacco, IV oxycodone   Syncope   PNA (pneumonia)   Acute respiratory failure with hypoxia   Multifocal pneumonia - Possibly community-acquired pneumonia. However, it could also be due to aspiration pneumonia during episode of drowsiness. - Continue IV Levaquin, bronchodilator nebulizations and oxygen. - Will need followup imaging in 4-6 weeks to ensure resolution of pneumonia findings.? Prominent subcarinal node can be addressed at the same time.  Acute asthmatic bronchitis - Precipitated by pneumonia - Tobacco cessation counseled - Management as above and add IV Solu-Medrol.  Acute hypoxic respiratory failure - Secondary to pneumonia and acute asthmatic bronchitis. Management as above. - Monitor oxygen saturations  Altered mental status - Probably multifactorial secondary to polysubstance abuse, pneumonia and hypoxia -  Improved/resolved. Monitor closely. Avoid sedative medications.  Polysubstance abuse-heroine, tobacco, IV oxycodone & benzodiazepines - Cessation counseled. -  Check HIV antibodies. Patient requests hepatitis testing.  History of syncope - This was 2 weeks ago. Etiology unclear but could be secondary to substance abuse & related sedation. - Monitor on telemetry for arrhythmias. Check 2-D echo to rule out cardiomyopathy. - Check orthostatic blood pressures.  History of anxiety, panic attacks and depression/bipolar disorder - Patient follows with outpatient psychiatry. - Hold home psychiatric medications for tonight. Patient states that she is taking lorazepam when necessary-however does not appear on her home medication list. Please verify psych medications with her primary psychiatrist in the morning or consult psychiatry.  Anemia - Possibly chronic. Follow CBCs   Code Status: Full  Family  Communication: Discussed with patient's mother at bedside after patient's consent.  Disposition Plan: Home when medically stable.   Time spent: 70 minutes  Christie Lung, MD, FACP, FHM. [facility] Pager [phone]  If 7PM-7AM, please contact night-coverage [email or web address] Password [email or web address] 04/04/2013, 12:45 AM

## 2013-04-04 NOTE — Progress Notes (Signed)
  Echocardiogram 2D Echocardiogram has been performed.  Christie Ruiz M 04/04/2013, 9:54 AM

## 2013-04-04 NOTE — Progress Notes (Signed)
ANTIBIOTIC CONSULT NOTE - INITIAL  Pharmacy Consult for levofloxacin Indication: pneumonia  No Known Allergies  Patient Measurements: Height: 5\' 4"  (162.6 cm) Weight: 177 lb 14.6 oz (80.7 kg) IBW/kg (Calculated) : 54.7 Adjusted Body Weight:   Vital Signs: Temp: 97.5 F (36.4 C) (12/14 0115) Temp src: Oral (12/14 0115) BP: 101/65 mmHg (12/14 0127) Pulse Rate: 90 (12/14 0127) Intake/Output from previous day:   Intake/Output from this shift:    Labs:  Recent Labs  04/03/13 2020 04/03/13 2210  WBC  --  6.6  HGB  --  11.9*  PLT  --  177  CREATININE 1.18*  --    Estimated Creatinine Clearance: 67.1 ml/min (by C-G formula based on Cr of 1.18). No results found for this basename: VANCOTROUGH, VANCOPEAK, VANCORANDOM, GENTTROUGH, GENTPEAK, GENTRANDOM, TOBRATROUGH, TOBRAPEAK, TOBRARND, AMIKACINPEAK, AMIKACINTROU, AMIKACIN,  in the last 72 hours   Microbiology: No results found for this or any previous visit (from the past 720 hour(s)).  Medical History: Past Medical History  Diagnosis Date  . Heroin abuse   . Anxiety and depression   . Tobacco abuse disorder   . Asthmatic bronchitis     Medications:  Anti-infectives   Start     Dose/Rate Route Frequency Ordered Stop   04/04/13 2200  levofloxacin (LEVAQUIN) IVPB 750 mg     750 mg 100 mL/hr over 90 Minutes Intravenous Every 24 hours 04/04/13 0311     04/03/13 2330  levofloxacin (LEVAQUIN) IVPB 750 mg     750 mg 100 mL/hr over 90 Minutes Intravenous  Once 04/03/13 2326       Assessment: Patient with PNA.  Goal of Therapy:  Levofloxacin dosed based on patient weight and renal function   Plan:  Follow up culture results Levofloxacin 750mg  iv q24hr      Aleene Davidson Crowford 04/04/2013,3:13 AM

## 2013-04-04 NOTE — ED Notes (Signed)
Orders for BC x 2 noted Admitting MD at bedside and asked Phlebotomy staff to wait until consult completed

## 2013-04-04 NOTE — Progress Notes (Signed)
2:14 PM I agree with HPI/GPe and A/P per Dr. Waymon Amato      Patient feels a little better.  O2 sats of oxygen are 94 percentile.  No n/v/cp.  Emotional about being away from 37 yr old daughter.  States she takes oral opiates and has also recently shot up IV drugs about 4 days ago.  Has been using IVD since 1999.  No CP/N/V/SOb/CP   HEENT-sad appeariong CF, looking older than stated age CHEST-clear, no rales/rhonchi CARDIAC-s1 s2 no m/r/g ABDOMEN-soft NEURO-nad SKIN/MUSCULAR-nad  Patient Active Problem List   Diagnosis Date Noted  . Altered mental status 04/04/2013  . Asthmatic bronchitis 04/04/2013  . Polysubstance abuse - heroin, tobacco, IV oxycodone 04/04/2013  . Syncope 04/04/2013  . PNA (pneumonia) 04/04/2013  . Acute respiratory failure with hypoxia 04/04/2013  . Pneumonia 04/03/2013   Plan as per Dr. Waymon Amato Will continue to monitor WIll need IV abx for at least 1-2 dasy Home probably 12/16 am

## 2013-04-04 NOTE — ED Notes (Signed)
Report given to Pandora, RN on [address] All questions answered by the [employer]

## 2013-04-05 DIAGNOSIS — B1920 Unspecified viral hepatitis C without hepatic coma: Secondary | ICD-10-CM

## 2013-04-05 LAB — CBC
Hemoglobin: 11.5 g/dL — ABNORMAL LOW (ref 12.0–15.0)
MCH: 27.8 pg (ref 26.0–34.0)
MCHC: 32.3 g/dL (ref 30.0–36.0)
MCV: 86 fL (ref 78.0–100.0)
Platelets: 187 10*3/uL (ref 150–400)
RDW: 14.1 % (ref 11.5–15.5)

## 2013-04-05 LAB — BASIC METABOLIC PANEL
BUN: 18 mg/dL (ref 6–23)
Calcium: 9 mg/dL (ref 8.4–10.5)
GFR calc non Af Amer: 84 mL/min — ABNORMAL LOW (ref >90)
Glucose, Bld: 132 mg/dL — ABNORMAL HIGH (ref 70–99)

## 2013-04-05 MED ORDER — PREDNISONE 20 MG PO TABS: 60.0000 mg | ORAL_TABLET | Freq: Every day | ORAL | Status: DC

## 2013-04-05 MED ORDER — GUAIFENESIN ER 600 MG PO TB12: 600.0000 mg | ORAL_TABLET | Freq: Two times a day (BID) | ORAL | Status: DC

## 2013-04-05 MED ORDER — LEVOFLOXACIN 500 MG PO TABS: 500.0000 mg | ORAL_TABLET | Freq: Every day | ORAL | Status: DC

## 2013-04-05 MED ORDER — PREDNISONE 50 MG PO TABS
60.0000 mg | ORAL_TABLET | Freq: Every day | ORAL | Status: DC
  Filled 2013-04-05 (×2): qty 1

## 2013-04-05 NOTE — Discharge Summary (Signed)
Physician Discharge Summary  FATEMA RABE FAO:[id number] DOB: 02-19-1976 DOA: 04/03/2013  PCP: Dorrene German, MD  Admit date: 04/03/2013 Discharge date: 04/05/2013  Time spent: 35 minutes  Recommendations for Outpatient Follow-up:  1. Need to complete 10 days total of Levaquin recieved 2 days of IV therapy in the hospital 2. Needs HCV RNA followup, may need to followup at [facility] for consideration of specific therapy for hepatitis C 3. Advised completely desist from IV drug use 4. Sterroid burst 60 mg for 5 days of prednisone 5. Recommended to follow up with primary psychiatrist  Discharge Diagnoses:  Principal Problem:   Pneumonia Active Problems:   Altered mental status   Asthmatic bronchitis   Polysubstance abuse - heroin, tobacco, IV oxycodone   Syncope   PNA (pneumonia)   Acute respiratory failure with hypoxia   Hepatitis C   Discharge Condition: Stable  Diet recommendation: Regular  Filed Weights   04/04/13 0115  Weight: 80.7 kg (177 lb 14.6 oz)    History of present illness:  37 y.o. female with history of anxiety, panic attacks, depression/bipolar disorder, polysubstance abuse-tobacco, heroine, IV oxycodone and benzodiazepines, presented to the ED with above complaints. Patient and mother (after patient's consent) at bedside provided history. Patient apparently has had nonproductive cough, dyspnea and wheezing for almost 4 weeks. She was seen by her PCP and treated for asthmatic bronchitis with Z-Pak, steroids and inhalers. She completed the antibiotics but did not complete the steroid course. She felt better for a few days but over the last 1 week, as noted worsening nonproductive cough, intermittent worsening dyspnea and wheezing  She rapidly progressed and did well and was less wheezy on first day of admission and was transitioned off of IV Solu-Medrol, IV antibiotics to by mouth Levaquin, and was non-hypoxic on room air  She was noted to be hepatitis C  C-reactive positive and an HCV RNA was ordered to confirm the diagnosis.  Patient was discharged, she was stable and was given nonpharmacological options of anxiety management modalities including deep breathing.     Discharge Exam: Filed Vitals:   04/05/13 1426  BP: 124/77  Pulse: 63  Temp: 98.4 F (36.9 C)  Resp: 20  alert pleasant oriented no apparent distress tolerating diet, not using oxygen Eating and drinking   HEENT-sad appeariong CF, looking older than stated age  CHEST-clear, no rales/rhonchi  CARDIAC-s1 s2 no m/r/g  ABDOMEN-soft  NEURO-nad  SKIN/MUSCULAR-nad  Discharge Instructions  Discharge Orders   Future Orders Complete By Expires   Diet - low sodium heart healthy  As directed    Increase activity slowly  As directed        Medication List         albuterol 108 (90 BASE) MCG/ACT inhaler  Commonly known as:  PROVENTIL HFA;VENTOLIN HFA  Inhale 2 puffs into the lungs every 6 (six) hours as needed for wheezing or shortness of breath.     diphenhydrAMINE 25 MG tablet  Commonly known as:  BENADRYL  Take 25 mg by mouth every 6 (six) hours as needed.     guaiFENesin 600 MG 12 hr tablet  Commonly known as:  MUCINEX  Take 1 tablet (600 mg total) by mouth 2 (two) times daily.     levofloxacin 500 MG tablet  Commonly known as:  LEVAQUIN  Take 1 tablet (500 mg total) by mouth daily.     predniSONE 20 MG tablet  Commonly known as:  DELTASONE  Take 3 tablets (60 mg total)  by mouth daily before breakfast.     sertraline 100 MG tablet  Commonly known as:  ZOLOFT  Take 100 mg by mouth daily.     tiZANidine 4 MG tablet  Commonly known as:  ZANAFLEX  Take 4 mg by mouth every 6 (six) hours as needed (for muscle spasms).     zolpidem 10 MG tablet  Commonly known as:  AMBIEN  Take 10 mg by mouth at bedtime as needed for sleep.       No Known Allergies    The results of significant diagnostics from this hospitalization (including imaging,  microbiology, ancillary and laboratory) are listed below for reference.    Significant Diagnostic Studies: Dg Chest 2 View  04/03/2013   CLINICAL DATA:  Hypoxia and weakness.  Tremors.  History of smoking.  EXAM: CHEST  2 VIEW  COMPARISON:  None.  FINDINGS: The lungs are well-aerated and clear. There is no evidence of focal opacification, pleural effusion or pneumothorax.  The heart is normal in size; the mediastinal contour is within normal limits. No acute osseous abnormalities are seen.  IMPRESSION: No acute cardiopulmonary process seen.   Electronically Signed   By: Roanna Raider M.D.   On: 04/03/2013 21:13  In summary there were Ct Head Wo Contrast  04/03/2013   CLINICAL DATA:  Possible overdose. Posterior headache and weakness. Prior fall. Multiple episodes of syncope.  EXAM: CT HEAD WITHOUT CONTRAST  TECHNIQUE: Contiguous axial images were obtained from the base of the skull through the vertex without intravenous contrast.  COMPARISON:  None.  FINDINGS: There is no evidence of acute infarction, mass lesion, or intra- or extra-axial hemorrhage on CT.  The posterior fossa, including the cerebellum, brainstem and fourth ventricle, is within normal limits. The third and lateral ventricles, and basal ganglia are unremarkable in appearance. The cerebral hemispheres are symmetric in appearance, with normal gray-white differentiation. No mass effect or midline shift is seen.  There is no evidence of fracture; visualized osseous structures are unremarkable in appearance. The visualized portions of the orbits are within normal limits. The paranasal sinuses and mastoid air cells are well-aerated. No significant soft tissue abnormalities are seen.  IMPRESSION: Unremarkable noncontrast CT of the head.   Electronically Signed   By: Roanna Raider M.D.   On: 04/03/2013 21:14   Ct Angio Chest Pe W/cm &/or Wo Cm  04/03/2013   CLINICAL DATA:  Shortness of breath; hypoxia. Intermittent chest pain.  EXAM: CT  ANGIOGRAPHY CHEST WITH CONTRAST  TECHNIQUE: Multidetector CT imaging of the chest was performed using the standard protocol during bolus administration of intravenous contrast. Multiplanar CT image reconstructions including MIPs were obtained to evaluate the vascular anatomy.  CONTRAST:  OMNIPAQUE IOHEXOL 350 MG/ML SOLN  COMPARISON:  Chest radiograph performed earlier today at 08:55 p.m.  FINDINGS: There is no evidence of pulmonary embolus.  Focal airspace opacities within both lungs, with partial sparing of the lung apices, raise concern for mild multifocal pneumonia. There is no evidence of pleural effusion or pneumothorax. No masses are identified; no abnormal focal contrast enhancement is seen.  The mediastinum is unremarkable in appearance. There may be a mildly prominent subcarinal node, though this is not well assessed. No obvious mediastinal lymphadenopathy is seen. No pericardial effusion is identified. The great vessels are grossly unremarkable in appearance. No axillary lymphadenopathy is seen. The visualized portions of the thyroid gland are unremarkable in appearance.  The visualized portions of the liver and spleen are unremarkable. Mild degenerative change  is noted at the lower cervical spine.  No acute osseous abnormalities are seen.  Review of the MIP images confirms the above findings.  IMPRESSION: 1. No evidence of pulmonary embolus. 2. Focal bilateral airspace opacities, with partial sparing of the lung apices, concerning for mild multifocal pneumonia. 3. Question of mildly prominent subcarinal node.   Electronically Signed   By: Roanna Raider M.D.   On: 04/03/2013 23:04    Microbiology: Recent Results (from the past 240 hour(s))  CULTURE, BLOOD (ROUTINE X 2)     Status: None   Collection Time    04/04/13 12:29 AM      Result Value Range Status   Specimen Description BLOOD RIGHT HAND   Final   Special Requests BOTTLES DRAWN AEROBIC AND ANAEROBIC 5CC   Final   Culture  Setup Time      Final   Value: 04/04/2013 12:39     Performed at [facility]   Culture     Final   Value:        BLOOD CULTURE RECEIVED NO GROWTH TO DATE CULTURE WILL BE HELD FOR 5 DAYS BEFORE ISSUING A FINAL NEGATIVE REPORT     Performed at [facility]   Report Status PENDING   Incomplete  CULTURE, BLOOD (ROUTINE X 2)     Status: None   Collection Time    04/04/13 12:38 AM      Result Value Range Status   Specimen Description BLOOD LEFT HAND   Final   Special Requests BOTTLES DRAWN AEROBIC AND ANAEROBIC 5CC   Final   Culture  Setup Time     Final   Value: 04/04/2013 12:39     Performed at [facility]   Culture     Final   Value:        BLOOD CULTURE RECEIVED NO GROWTH TO DATE CULTURE WILL BE HELD FOR 5 DAYS BEFORE ISSUING A FINAL NEGATIVE REPORT     Performed at [facility]   Report Status PENDING   Incomplete     Labs: Basic Metabolic Panel:  Recent Labs Lab 04/03/13 2020 04/05/13 0532  NA 137 137  K 4.4 4.9  CL 102 103  CO2 26 23  GLUCOSE 84 132*  BUN 24* 18  CREATININE 1.18* 0.87  CALCIUM 9.1 9.0   Liver Function Tests:  Recent Labs Lab 04/03/13 2020  AST 26  ALT 32  ALKPHOS 59  BILITOT 0.2*  PROT 7.8  ALBUMIN 4.0   No results found for this basename: LIPASE, AMYLASE,  in the last 168 hours  Recent Labs Lab 04/03/13 2020  AMMONIA 36   CBC:  Recent Labs Lab 04/03/13 2210 04/05/13 0532  WBC 6.6 9.5  NEUTROABS 4.2  --   HGB 11.9* 11.5*  HCT 35.7* 35.6*  MCV 87.3 86.0  PLT 177 187   Cardiac Enzymes: No results found for this basename: CKTOTAL, CKMB, CKMBINDEX, TROPONINI,  in the last 168 hours BNP: BNP (last 3 results) No results found for this basename: PROBNP,  in the last 8760 hours CBG: No results found for this basename: GLUCAP,  in the last 168 hours     Signed:  Rhetta Mura  [facility] 04/05/2013, 3:45 PM

## 2013-04-05 NOTE — Progress Notes (Signed)
Discharge instructions given to pt, verbalized understanding. Left the unit in stable condition. 

## 2013-04-06 LAB — HCV RNA QUANT: HCV Quantitative: 1246498 IU/mL — ABNORMAL HIGH (ref <15)

## 2013-04-10 LAB — CULTURE, BLOOD (ROUTINE X 2)
Culture: NO GROWTH
Culture: NO GROWTH

## 2013-05-14 ENCOUNTER — Emergency Department (INDEPENDENT_AMBULATORY_CARE_PROVIDER_SITE_OTHER): Payer: Medicaid Other

## 2013-05-14 ENCOUNTER — Encounter (HOSPITAL_COMMUNITY): Payer: Self-pay | Admitting: Emergency Medicine

## 2013-05-14 ENCOUNTER — Emergency Department (HOSPITAL_COMMUNITY)
Admission: EM | Admit: 2013-05-14 | Discharge: 2013-05-14 | Disposition: A | Discharge status: Home / Self Care | Payer: Medicaid Other | Source: Home / Self Care | Attending: Family Medicine | Admitting: Family Medicine

## 2013-05-14 DIAGNOSIS — B1920 Unspecified viral hepatitis C without hepatic coma: Secondary | ICD-10-CM

## 2013-05-14 DIAGNOSIS — J45909 Unspecified asthma, uncomplicated: Secondary | ICD-10-CM

## 2013-05-14 MED ORDER — IPRATROPIUM-ALBUTEROL 0.5-2.5 (3) MG/3ML IN SOLN
RESPIRATORY_TRACT | Status: AC
Start: 2013-05-14 — End: 2013-05-14
  Filled 2013-05-14: qty 3

## 2013-05-14 MED ORDER — ALBUTEROL SULFATE HFA 108 (90 BASE) MCG/ACT IN AERS: 2.0000 | INHALATION_SPRAY | Freq: Four times a day (QID) | RESPIRATORY_TRACT | Status: AC | PRN

## 2013-05-14 MED ORDER — PREDNISONE 50 MG PO TABS: 50.0000 mg | ORAL_TABLET | Freq: Every day | ORAL | Status: DC

## 2013-05-14 MED ORDER — IPRATROPIUM-ALBUTEROL 0.5-2.5 (3) MG/3ML IN SOLN
3.0000 mL | Freq: Once | RESPIRATORY_TRACT | Status: AC
  Administered 2013-05-14: 3 mL via RESPIRATORY_TRACT

## 2013-05-14 NOTE — ED Provider Notes (Signed)
Christie Ruiz is a 38 y.o. female who presents to Urgent Care today for cough congestion wheezing. This is been present for the last 3-4 days. Patient has a recent history of pneumonia requiring inpatient treatment in December of 2014 area and she is concerned that she may develop pneumonia again. She denies any significant shortness of breath nausea vomiting or diarrhea.   Past Medical History  Diagnosis Date  . Heroin abuse   . Anxiety and depression   . Tobacco abuse disorder   . Asthmatic bronchitis    History  Substance Use Topics  . Smoking status: Current Every Day Smoker  . Smokeless tobacco: Not on file  . Alcohol Use: Yes   ROS as above Medications: No current facility-administered medications for this encounter.   Current Outpatient Prescriptions  Medication Sig Dispense Refill  . DICLOFENAC PO Take by mouth.      Marland Kitchen albuterol (PROVENTIL HFA;VENTOLIN HFA) 108 (90 BASE) MCG/ACT inhaler Inhale 2 puffs into the lungs every 6 (six) hours as needed for wheezing or shortness of breath.      Marland Kitchen albuterol (PROVENTIL HFA;VENTOLIN HFA) 108 (90 BASE) MCG/ACT inhaler Inhale 2 puffs into the lungs every 6 (six) hours as needed for wheezing or shortness of breath.  1 Inhaler  2  . diphenhydrAMINE (BENADRYL) 25 MG tablet Take 25 mg by mouth every 6 (six) hours as needed.      Marland Kitchen guaiFENesin (MUCINEX) 600 MG 12 hr tablet Take 1 tablet (600 mg total) by mouth 2 (two) times daily.      Marland Kitchen levofloxacin (LEVAQUIN) 500 MG tablet Take 1 tablet (500 mg total) by mouth daily.  8 tablet  0  . predniSONE (DELTASONE) 50 MG tablet Take 1 tablet (50 mg total) by mouth daily.  5 tablet  0  . sertraline (ZOLOFT) 100 MG tablet Take 100 mg by mouth daily.      Marland Kitchen tiZANidine (ZANAFLEX) 4 MG tablet Take 4 mg by mouth every 6 (six) hours as needed (for muscle spasms).      . zolpidem (AMBIEN) 10 MG tablet Take 10 mg by mouth at bedtime as needed for sleep.        Exam:  BP 106/66  Pulse 79  Temp(Src) 98.4  F (36.9 C) (Oral)  Resp 12  SpO2 100%  LMP 04/18/2013 Gen: Well NAD HEENT: EOMI,  MMM Lungs: Normal work of breathing. Coarse breath sounds and rhonchi bilaterally Heart: RRR no MRG Abd: NABS, Soft. NT, ND Exts: Brisk capillary refill, warm and well perfused.   Patient was given a DuoNeb nebulizer treatment and had some improvement in symptoms.  No results found for this or any previous visit (from the past 24 hour(s)). Dg Chest 2 View  05/14/2013   CLINICAL DATA:  Cough  EXAM: CHEST  2 VIEW  COMPARISON:  None.  FINDINGS: Normal heart size. Clear lungs. Bronchitic changes. No pleural effusion. Mild hyperaeration.  IMPRESSION: Mild hyperaeration.  Otherwise, no active cardiopulmonary disease.   Electronically Signed   By: Maryclare Bean M.D.   On: 05/14/2013 19:51    Assessment and Plan: 38 y.o. female with bronchitis versus COPD exacerbation.  Plan to treat with prednisone and albuterol.  Additionally patient should followup with the hepatitis C clinic.  Discussed warning signs or symptoms. Please see discharge instructions. Patient expresses understanding.    Gregor Hams, MD 05/14/13 2011

## 2013-05-14 NOTE — ED Notes (Signed)
Coughing, wheezing, onset 3-4 days ago.  Reports antibiotic tx x 2 .

## 2013-05-14 NOTE — Discharge Instructions (Signed)
Thank you for coming in today. Followup with your primary care provider. Followup with the infectious disease clinic. Take prednisone daily for 5 days. Use albuterol as needed. Call or go to the emergency room if you get worse, have trouble breathing, have chest pains, or palpitations.   Asthma, Adult Asthma is a recurring condition in which the airways tighten and narrow. Asthma can make it difficult to breathe. It can cause coughing, wheezing, and shortness of breath. Asthma episodes (also called asthma attacks) range from minor to life-threatening. Asthma cannot be cured, but medicines and lifestyle changes can help control it. CAUSES Asthma is believed to be caused by inherited (genetic) and environmental factors, but its exact cause is unknown. Asthma may be triggered by allergens, lung infections, or irritants in the air. Asthma triggers are different for each person. Common triggers include:   Animal dander.  Dust mites.  Cockroaches.  Pollen from trees or grass.  Mold.  Smoke.  Air pollutants such as dust, household cleaners, hair sprays, aerosol sprays, paint fumes, strong chemicals, or strong odors.  Cold air, weather changes, and winds (which increase molds and pollens in the air).  Strong emotional expressions such as crying or laughing hard.  Stress.  Certain medicines (such as aspirin) or types of drugs (such as beta-blockers).  Sulfites in foods and drinks. Foods and drinks that may contain sulfites include dried fruit, potato chips, and sparkling grape juice.  Infections or inflammatory conditions such as the flu, a cold, or an inflammation of the nasal membranes (rhinitis).  Gastroesophageal reflux disease (GERD).  Exercise or strenuous activity. SYMPTOMS Symptoms may occur immediately after asthma is triggered or many hours later. Symptoms include:  Wheezing.  Excessive nighttime or early morning coughing.  Frequent or severe coughing with a common  cold.  Chest tightness.  Shortness of breath. DIAGNOSIS  The diagnosis of asthma is made by a review of your medical history and a physical exam. Tests may also be performed. These may include:  Lung function studies. These tests show how much air you breath in and out.  Allergy tests.  Imaging tests such as X-rays. TREATMENT  Asthma cannot be cured, but it can usually be controlled. Treatment involves identifying and avoiding your asthma triggers. It also involves medicines. There are 2 classes of medicine used for asthma treatment:   Controller medicines. These prevent asthma symptoms from occurring. They are usually taken every day.  Reliever or rescue medicines. These quickly relieve asthma symptoms. They are used as needed and provide short-term relief. Your health care provider will help you create an asthma action plan. An asthma action plan is a written plan for managing and treating your asthma attacks. It includes a list of your asthma triggers and how they may be avoided. It also includes information on when medicines should be taken and when their dosage should be changed. An action plan may also involve the use of a device called a peak flow meter. A peak flow meter measures how well the lungs are working. It helps you monitor your condition. HOME CARE INSTRUCTIONS   Take medicine as directed by your health care provider. Speak with your health care provider if you have questions about how or when to take the medicines.  Use a peak flow meter as directed by your health care provider. Record and keep track of readings.  Understand and use the action plan to help minimize or stop an asthma attack without needing to seek medical care.  Control  your home environment in the following ways to help prevent asthma attacks:  Do not smoke. Avoid being exposed to secondhand smoke.  Change your heating and air conditioning filter regularly.  Limit your use of fireplaces and wood  stoves.  Get rid of pests (such as roaches and mice) and their droppings.  Throw away plants if you see mold on them.  Clean your floors and dust regularly. Use unscented cleaning products.  Try to have someone else vacuum for you regularly. Stay out of rooms while they are being vacuumed and for a short while afterward. If you vacuum, use a dust mask from a hardware store, a double-layered or microfilter vacuum cleaner bag, or a vacuum cleaner with a HEPA filter.  Replace carpet with wood, tile, or vinyl flooring. Carpet can trap dander and dust.  Use allergy-proof pillows, mattress covers, and box spring covers.  Wash bed sheets and blankets every week in hot water and dry them in a dryer.  Use blankets that are made of polyester or cotton.  Clean bathrooms and kitchens with bleach. If possible, have someone repaint the walls in these rooms with mold-resistant paint. Keep out of the rooms that are being cleaned and painted.  Wash hands frequently. SEEK MEDICAL CARE IF:   You have wheezing, shortness of breath, or a cough even if taking medicine to prevent attacks.  The colored mucus you cough up (sputum) is thicker than usual.  Your sputum changes from clear or white to yellow, green, gray, or bloody.  You have any problems that may be related to the medicines you are taking (such as a rash, itching, swelling, or trouble breathing).  You are using a reliever medicine more than 2 3 times per week.  Your peak flow is still at 50 79% of you personal best after following your action plan for 1 hour. SEEK IMMEDIATE MEDICAL CARE IF:   You seem to be getting worse and are unresponsive to treatment during an asthma attack.  You are short of breath even at rest.  You get short of breath when doing very little physical activity.  You have difficulty eating, drinking, or talking due to asthma symptoms.  You develop chest pain.  You develop a fast heartbeat.  You have a bluish  color to your lips or fingernails.  You are lightheaded, dizzy, or faint.  Your peak flow is less than 50% of your personal best.  You have a fever or persistent symptoms for more than 2 3 days.  You have a fever and symptoms suddenly get worse. MAKE SURE YOU:   Understand these instructions.  Will watch your condition.  Will get help right away if you are not doing well or get worse. Document Released: 04/08/2005 Document Revised: 12/09/2012 Document Reviewed: 11/05/2012 [facility] Patient Information 2014 [place], [place].

## 2013-05-30 ENCOUNTER — Emergency Department (HOSPITAL_COMMUNITY)
Admission: EM | Admit: 2013-05-30 | Discharge: 2013-05-30 | Disposition: A | Discharge status: Home / Self Care | Payer: Medicaid Other | Attending: Emergency Medicine | Admitting: Emergency Medicine

## 2013-05-30 ENCOUNTER — Encounter (HOSPITAL_COMMUNITY): Payer: Self-pay | Admitting: Emergency Medicine

## 2013-05-30 DIAGNOSIS — M25512 Pain in left shoulder: Secondary | ICD-10-CM

## 2013-05-30 DIAGNOSIS — Z79899 Other long term (current) drug therapy: Secondary | ICD-10-CM | POA: Insufficient documentation

## 2013-05-30 DIAGNOSIS — F17200 Nicotine dependence, unspecified, uncomplicated: Secondary | ICD-10-CM | POA: Insufficient documentation

## 2013-05-30 DIAGNOSIS — G8929 Other chronic pain: Secondary | ICD-10-CM | POA: Insufficient documentation

## 2013-05-30 DIAGNOSIS — M25519 Pain in unspecified shoulder: Secondary | ICD-10-CM | POA: Insufficient documentation

## 2013-05-30 DIAGNOSIS — M542 Cervicalgia: Secondary | ICD-10-CM | POA: Insufficient documentation

## 2013-05-30 DIAGNOSIS — Z791 Long term (current) use of non-steroidal anti-inflammatories (NSAID): Secondary | ICD-10-CM | POA: Insufficient documentation

## 2013-05-30 DIAGNOSIS — R51 Headache: Secondary | ICD-10-CM | POA: Insufficient documentation

## 2013-05-30 DIAGNOSIS — F329 Major depressive disorder, single episode, unspecified: Secondary | ICD-10-CM | POA: Insufficient documentation

## 2013-05-30 DIAGNOSIS — F411 Generalized anxiety disorder: Secondary | ICD-10-CM | POA: Insufficient documentation

## 2013-05-30 DIAGNOSIS — F3289 Other specified depressive episodes: Secondary | ICD-10-CM | POA: Insufficient documentation

## 2013-05-30 DIAGNOSIS — J45909 Unspecified asthma, uncomplicated: Secondary | ICD-10-CM | POA: Insufficient documentation

## 2013-05-30 MED ORDER — KETOROLAC TROMETHAMINE 60 MG/2ML IM SOLN
30.0000 mg | Freq: Once | INTRAMUSCULAR | Status: AC
  Administered 2013-05-30: 30 mg via INTRAMUSCULAR
  Filled 2013-05-30: qty 2

## 2013-05-30 MED ORDER — ACETAMINOPHEN 325 MG PO TABS
650.0000 mg | ORAL_TABLET | Freq: Once | ORAL | Status: AC
  Administered 2013-05-30: 650 mg via ORAL
  Filled 2013-05-30: qty 2

## 2013-05-30 MED ORDER — KETOROLAC TROMETHAMINE 60 MG/2ML IM SOLN: 60.0000 mg | Freq: Once | INTRAMUSCULAR | Status: DC

## 2013-05-30 MED ORDER — METHOCARBAMOL 500 MG PO TABS: 500.0000 mg | ORAL_TABLET | Freq: Two times a day (BID) | ORAL | Status: DC

## 2013-05-30 NOTE — Discharge Instructions (Signed)
Follow up with your PCP in 1 week for further evaluation of Shoulder pain. I would recommend MRI imaging to assess for any nerve impingement or disc herniation. Return to ED should your symptoms worsen.
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
## 2013-06-01 ENCOUNTER — Other Ambulatory Visit: Payer: Self-pay | Admitting: Internal Medicine

## 2013-06-01 DIAGNOSIS — M542 Cervicalgia: Secondary | ICD-10-CM

## 2013-06-02 NOTE — ED Provider Notes (Signed)
Medical screening examination/treatment/procedure(s) were performed by non-physician practitioner and as supervising physician I was immediately available for consultation/collaboration.  EKG Interpretation   None        Leota Jacobsen, MD 06/02/13 [phone]

## 2013-06-03 ENCOUNTER — Ambulatory Visit
Admission: RE | Admit: 2013-06-03 | Discharge: 2013-06-03 | Disposition: A | Discharge status: Home / Self Care | Payer: Medicaid Other | Source: Ambulatory Visit | Attending: Internal Medicine | Admitting: Internal Medicine

## 2013-06-03 DIAGNOSIS — M542 Cervicalgia: Secondary | ICD-10-CM

## 2013-06-04 ENCOUNTER — Other Ambulatory Visit: Payer: Medicaid Other

## 2013-06-08 ENCOUNTER — Other Ambulatory Visit: Payer: Self-pay | Admitting: *Deleted

## 2013-06-09 ENCOUNTER — Other Ambulatory Visit: Payer: Self-pay | Admitting: Internal Medicine

## 2013-06-09 DIAGNOSIS — C220 Liver cell carcinoma: Secondary | ICD-10-CM

## 2013-06-15 ENCOUNTER — Other Ambulatory Visit: Payer: Medicaid Other

## 2013-06-16 ENCOUNTER — Ambulatory Visit
Admission: RE | Admit: 2013-06-16 | Discharge: 2013-06-16 | Disposition: A | Discharge status: Home / Self Care | Payer: Medicaid Other | Source: Ambulatory Visit | Attending: Internal Medicine | Admitting: Internal Medicine

## 2013-06-16 DIAGNOSIS — C220 Liver cell carcinoma: Secondary | ICD-10-CM

## 2013-06-21 ENCOUNTER — Encounter (HOSPITAL_COMMUNITY): Payer: Self-pay | Admitting: Emergency Medicine

## 2013-06-21 ENCOUNTER — Emergency Department (HOSPITAL_COMMUNITY)
Admission: EM | Admit: 2013-06-21 | Discharge: 2013-06-22 | Disposition: A | Discharge status: Home / Self Care | Payer: Medicaid Other | Attending: Emergency Medicine | Admitting: Emergency Medicine

## 2013-06-21 DIAGNOSIS — F17200 Nicotine dependence, unspecified, uncomplicated: Secondary | ICD-10-CM | POA: Insufficient documentation

## 2013-06-21 DIAGNOSIS — F329 Major depressive disorder, single episode, unspecified: Secondary | ICD-10-CM

## 2013-06-21 DIAGNOSIS — Z79899 Other long term (current) drug therapy: Secondary | ICD-10-CM | POA: Insufficient documentation

## 2013-06-21 DIAGNOSIS — J45909 Unspecified asthma, uncomplicated: Secondary | ICD-10-CM | POA: Insufficient documentation

## 2013-06-21 DIAGNOSIS — F32A Depression, unspecified: Secondary | ICD-10-CM

## 2013-06-21 DIAGNOSIS — F411 Generalized anxiety disorder: Secondary | ICD-10-CM | POA: Insufficient documentation

## 2013-06-21 DIAGNOSIS — F3289 Other specified depressive episodes: Secondary | ICD-10-CM | POA: Insufficient documentation

## 2013-06-21 LAB — COMPREHENSIVE METABOLIC PANEL
ALBUMIN: 3.7 g/dL (ref 3.5–5.2)
ALK PHOS: 77 U/L (ref 39–117)
ALT: 196 U/L — ABNORMAL HIGH (ref 0–35)
AST: 133 U/L — ABNORMAL HIGH (ref 0–37)
BUN: 18 mg/dL (ref 6–23)
CHLORIDE: 102 meq/L (ref 96–112)
CO2: 27 meq/L (ref 19–32)
Calcium: 9.1 mg/dL (ref 8.4–10.5)
Creatinine, Ser: 0.85 mg/dL (ref 0.50–1.10)
GFR calc Af Amer: >90 mL/min (ref >90)
GFR calc non Af Amer: 86 mL/min — ABNORMAL LOW (ref >90)
Glucose, Bld: 92 mg/dL (ref 70–99)
Potassium: 4.7 mEq/L (ref 3.7–5.3)
Sodium: 139 mEq/L (ref 137–147)
Total Bilirubin: 0.2 mg/dL — ABNORMAL LOW (ref 0.3–1.2)
Total Protein: 7.7 g/dL (ref 6.0–8.3)

## 2013-06-21 LAB — RAPID URINE DRUG SCREEN, HOSP PERFORMED
AMPHETAMINES: NOT DETECTED
BENZODIAZEPINES: POSITIVE — AB
Barbiturates: NOT DETECTED
Cocaine: NOT DETECTED
Opiates: POSITIVE — AB
Tetrahydrocannabinol: NOT DETECTED

## 2013-06-21 LAB — CBC
HEMATOCRIT: 37.5 % (ref 36.0–46.0)
Hemoglobin: 12.1 g/dL (ref 12.0–15.0)
MCH: 27.3 pg (ref 26.0–34.0)
MCHC: 32.3 g/dL (ref 30.0–36.0)
MCV: 84.7 fL (ref 78.0–100.0)
Platelets: 223 10*3/uL (ref 150–400)
RBC: 4.43 MIL/uL (ref 3.87–5.11)
RDW: 14.2 % (ref 11.5–15.5)
WBC: 4.8 10*3/uL (ref 4.0–10.5)

## 2013-06-21 LAB — SALICYLATE LEVEL: Salicylate Lvl: <2 mg/dL — ABNORMAL LOW (ref 2.8–20.0)

## 2013-06-21 LAB — ETHANOL: Alcohol, Ethyl (B): <11 mg/dL (ref 0–11)

## 2013-06-21 NOTE — ED Notes (Signed)
Pt reports she relapsed back into heroin use on Friday, states she has a long Hx of depression and drug use. States she wants help. Denies Suicidal Ideation although expresses thoughts of hopelessness.

## 2013-06-21 NOTE — ED Provider Notes (Signed)
CSN: [id number]     Arrival date & time 06/21/13  1935 History   First MD Initiated Contact with Patient 06/21/13 2144     This chart was scribed for non-physician practitioner, Junius Creamer, [place] working with Mariea Clonts, MD by Forrestine Him, ED Scribe. This patient was seen in room WTR5/WTR5 and the patient's care was started at 9:51 PM.   Chief Complaint  Patient presents with  . Medical Clearance   The history is provided by the patient.    HPI Comments: Christie Ruiz is a 38 y.o. female with a PMHx of heroin abuse, anxiety, and depression who presents to the Emergency Department seeking medical clearance today. Pt reports ongoing, moderate depression, and is hoping to seek treatment. She states she has been to [facility] for follow up, but feels she has been given the "run around" in the last few weeks. In addition, relative states she has starting using heroin 3 days ago, but denies any ongoing use. Pt is currently on Zoloft and Lorazepam prescribed by her PCP for her depression. No other complaints or concerns this visit.  PCP: Philis Fendt, MD   Past Medical History  Diagnosis Date  . Heroin abuse   . Anxiety and depression   . Tobacco abuse disorder   . Asthmatic bronchitis    History reviewed. No pertinent past surgical history. No family history on file. History  Substance Use Topics  . Smoking status: Current Every Day Smoker  . Smokeless tobacco: Not on file  . Alcohol Use: Yes   OB History   Grav Para Term Preterm Abortions TAB SAB Ect Mult Living                 Review of Systems  Constitutional: Negative for fever and chills.  HENT: Negative for congestion.   Eyes: Negative for redness.  Respiratory: Negative for cough.   Skin: Negative for rash.  Neurological: Negative for dizziness.  Psychiatric/Behavioral: Negative for suicidal ideas, confusion and sleep disturbance.  All other systems reviewed and are negative.      Allergies  Review of  patient's allergies indicates no known allergies.  Home Medications   Current Outpatient Rx  Name  Route  Sig  Dispense  Refill  . albuterol (PROVENTIL HFA;VENTOLIN HFA) 108 (90 BASE) MCG/ACT inhaler   Inhalation   Inhale 2 puffs into the lungs every 6 (six) hours as needed for wheezing or shortness of breath.   1 Inhaler   2   . cyclobenzaprine (FLEXERIL) 10 MG tablet   Oral   Take 10 mg by mouth 3 (three) times daily as needed for muscle spasms.         . diphenhydrAMINE (BENADRYL) 25 MG tablet   Oral   Take 50 mg by mouth every 6 (six) hours as needed (sleep).          . LORazepam (ATIVAN) 0.5 MG tablet   Oral   Take 0.5 mg by mouth every 4 (four) hours as needed for anxiety (anxiety).         . sertraline (ZOLOFT) 100 MG tablet   Oral   Take 200 mg by mouth daily.          . traZODone (DESYREL) 100 MG tablet   Oral   Take 100 mg by mouth at bedtime.         Marland Kitchen zolpidem (AMBIEN) 10 MG tablet   Oral   Take 10 mg by mouth at bedtime as needed for  sleep.          Triage Vitals: BP 123/85  Pulse 88  Temp(Src) 98.6 F (37 C) (Oral)  Resp 20  SpO2 94%  LMP 05/23/2013   Physical Exam  Nursing note and vitals reviewed. Constitutional: She is oriented to person, place, and time. She appears well-developed and well-nourished.  HENT:  Head: Normocephalic and atraumatic.  Eyes: EOM are normal.  Neck: Normal range of motion.  Cardiovascular: Normal rate.   Pulmonary/Chest: Effort normal.  Musculoskeletal: Normal range of motion.  Neurological: She is alert and oriented to person, place, and time.  Skin: Skin is warm and dry.  Psychiatric: Her behavior is normal. Judgment normal. Cognition and memory are normal. She exhibits a depressed mood. She expresses no homicidal and no suicidal ideation. She expresses no suicidal plans and no homicidal plans.    ED Course  Procedures (including critical care time)  DIAGNOSTIC STUDIES: Oxygen Saturation is 94% on  RA, adequate by my interpretation.    COORDINATION OF CARE: 9:48 PM- Will order blood work and TTS consult. Discussed treatment plan with pt at bedside and pt agreed to plan.     Labs Review Labs Reviewed  COMPREHENSIVE METABOLIC PANEL - Abnormal; Notable for the following:    AST 133 (*)    ALT 196 (*)    Total Bilirubin 0.2 (*)    GFR calc non Af Amer 86 (*)    All other components within normal limits  SALICYLATE LEVEL - Abnormal; Notable for the following:    Salicylate Lvl <1.6 (*)    All other components within normal limits  CBC  ETHANOL  URINE RAPID DRUG SCREEN (HOSP PERFORMED)   Imaging Review No results found.   EKG Interpretation None     Will have patient speak with TTS MDM   Final diagnoses:  None      I personally performed the services described in this documentation, which was scribed in my presence. The recorded information has been reviewed and is accurate.  Garald Balding, NP 06/21/13 2252

## 2013-06-22 ENCOUNTER — Inpatient Hospital Stay (HOSPITAL_COMMUNITY): Admission: AD | Admit: 2013-06-22 | Payer: Medicaid Other | Source: Intra-hospital | Admitting: Psychiatry

## 2013-06-22 DIAGNOSIS — F1110 Opioid abuse, uncomplicated: Secondary | ICD-10-CM

## 2013-06-22 DIAGNOSIS — F331 Major depressive disorder, recurrent, moderate: Secondary | ICD-10-CM

## 2013-06-22 MED ORDER — SERTRALINE HCL 50 MG PO TABS
200.0000 mg | ORAL_TABLET | Freq: Every day | ORAL | Status: DC
  Administered 2013-06-22: 200 mg via ORAL
  Filled 2013-06-22: qty 4

## 2013-06-22 MED ORDER — TRAZODONE HCL 100 MG PO TABS: 100.0000 mg | ORAL_TABLET | Freq: Every day | ORAL | Status: DC

## 2013-06-22 MED ORDER — ZOLPIDEM TARTRATE 10 MG PO TABS: 10.0000 mg | ORAL_TABLET | Freq: Every evening | ORAL | Status: DC | PRN

## 2013-06-22 MED ORDER — DIPHENHYDRAMINE HCL 25 MG PO TABS
50.0000 mg | ORAL_TABLET | Freq: Four times a day (QID) | ORAL | Status: DC | PRN
  Filled 2013-06-22: qty 2

## 2013-06-22 MED ORDER — LORAZEPAM 0.5 MG PO TABS: 0.5000 mg | ORAL_TABLET | ORAL | Status: DC | PRN

## 2013-06-22 MED ORDER — CYCLOBENZAPRINE HCL 10 MG PO TABS
10.0000 mg | ORAL_TABLET | Freq: Three times a day (TID) | ORAL | Status: DC | PRN
  Administered 2013-06-22: 10 mg via ORAL
  Filled 2013-06-22: qty 1

## 2013-06-22 MED ORDER — ALBUTEROL SULFATE HFA 108 (90 BASE) MCG/ACT IN AERS: 2.0000 | INHALATION_SPRAY | Freq: Four times a day (QID) | RESPIRATORY_TRACT | Status: DC | PRN

## 2013-06-22 NOTE — BH Assessment (Signed)
Assessment Note   Christie Ruiz is a 38 year old white female requesting assistance with medication adjustment and substance abuse detox.    Patient reports that she has been to [facility] for follow up, but feels she has been given the "run around" in the last few weeks.   Patient reports that there is a problem with her medication and she is not able to speak to anyone at [facility] due to the weather.  Patient reports that she attempted to go to [facility] but there were not able to see her because [facility] has not released her records to them as of yet.  Therefore, the patient reports that she has begun to abuse heroin again because she is not able to handle the depression and anxiety   Patient reports a history of with heroin abuse, anxiety, and depression who presents to the Emergency Department seeking medical clearance today. Pt reports ongoing, moderate depression, and is hoping to seek treatment.    Patient reports using heroin 3 days ago, but denies any ongoing use. Pt is currently on Zoloft and Lorazepam prescribed by her PCP for her depression.  Patient reports that she relapsed back to heroin use on Friday,   Patient denies suicidal ideation although expresses thoughts of hopelessness.  Patient denies homicidal ideation and psychosis.  Patient denies prior psychiatric hospitalization  Axis I: Major Depression, Recurrent severe Opiate Abuse  Axis II: Deferred Axis III:  Past Medical History  Diagnosis Date  . Heroin abuse   . Anxiety and depression   . Tobacco abuse disorder   . Asthmatic bronchitis    Axis IV: economic problems, occupational problems, other psychosocial or environmental problems, problems related to social environment and problems with primary support group Axis V: 31-40 impairment in reality testing  Past Medical History:  Past Medical History  Diagnosis Date  . Heroin abuse   . Anxiety and depression   . Tobacco abuse disorder   . Asthmatic  bronchitis     History reviewed. No pertinent past surgical history.  Family History: No family history on file.  Social History:  reports that she has been smoking.  She does not have any smokeless tobacco history on file. She reports that she drinks alcohol. She reports that she uses illicit drugs.  Additional Social History:     CIWA: CIWA-Ar BP: 125/86 mmHg Pulse Rate: 81 COWS:    Allergies: No Known Allergies  Home Medications:  (Not in a hospital admission)  OB/GYN Status:  Patient's last menstrual period was 05/23/2013.  General Assessment Data Location of Assessment: [facility] ED Is this a Tele or Face-to-Face Assessment?: Face-to-Face Is this an Initial Assessment or a Re-assessment for this encounter?: Initial Assessment Living Arrangements: Children;Other relatives Can pt return to current living arrangement?: Yes Admission Status: Voluntary Is patient capable of signing voluntary admission?: Yes Transfer from: [facility] Referral Source: Self/Family/Friend  Medical Screening Exam ([place]) Medical Exam completed: Yes  [place] Living Arrangements: Children;Other relatives Name of Psychiatrist: [facility] Sessions  Name of Therapist: [facility]   Education Status Is patient currently in school?: No Current Grade: NA Highest grade of school patient has completed: NA Name of school: NA Contact person: NA  Risk to self Suicidal Ideation: No Suicidal Intent: No Is patient at risk for suicide?: No Suicidal Plan?: No Access to Means: No What has been your use of drugs/alcohol within the last 12 months?: Heroin Previous Attempts/Gestures: No How many times?: 0 Other Self Harm  Risks: NA Triggers for Past Attempts: None known Intentional Self Injurious Behavior: None Family Suicide History: No Recent stressful life event(s): Conflict (Comment);Job Loss;Financial Problems Persecutory voices/beliefs?: No Depression: Yes Depression Symptoms:  Despondent;Insomnia;Tearfulness;Isolating;Fatigue;Guilt;Loss of interest in usual pleasures;Feeling worthless/self pity;Feeling angry/irritable Substance abuse history and/or treatment for substance abuse?: Yes Suicide prevention information given to non-admitted patients: Not applicable  Risk to Others Homicidal Ideation: No Thoughts of Harm to Others: No Current Homicidal Intent: No Current Homicidal Plan: No Access to Homicidal Means: No Identified Victim: NA History of harm to others?: No Assessment of Violence: None Noted Violent Behavior Description: NA Does patient have access to weapons?: No Criminal Charges Pending?: No Does patient have a court date: No  Psychosis Hallucinations: None noted Delusions: None noted  Mental Status Report Appear/Hygiene: Disheveled Eye Contact: Fair Motor Activity: Restlessness Speech: Logical/coherent Level of Consciousness: Alert Mood: Depressed;Anxious Affect: Anxious Anxiety Level: Minimal Thought Processes: Coherent;Relevant Judgement: Unimpaired Orientation: Person;Place;Time;Situation Obsessive Compulsive Thoughts/Behaviors: None  Cognitive Functioning Concentration: Decreased Memory: Recent Intact;Remote Intact IQ: Average Insight: Fair Impulse Control: Poor Appetite: Fair Weight Loss: 0 Weight Gain: 0 Sleep: Decreased Total Hours of Sleep: 4 Vegetative Symptoms: Decreased grooming  ADLScreening [facility] Assessment Services) Patient's cognitive ability adequate to safely complete daily activities?: Yes Patient able to express need for assistance with ADLs?: Yes Independently performs ADLs?: Yes (appropriate for developmental age)  Prior Inpatient Therapy Prior Inpatient Therapy: Yes Prior Therapy Dates: 1990 Prior Therapy Facilty/Provider(s): Unable to remember the name of the facility Reason for Treatment: [facility]  Prior Outpatient Therapy Prior Outpatient Therapy: Yes Prior Therapy Dates: Ongoing Prior  Therapy Facilty/Provider(s): [facility] and [facility]  Reason for Treatment: Medication Management and Therapy  ADL Screening (condition at time of admission) Patient's cognitive ability adequate to safely complete daily activities?: Yes Patient able to express need for assistance with ADLs?: Yes Independently performs ADLs?: Yes (appropriate for developmental age)         Values / Beliefs Cultural Requests During Hospitalization: None Spiritual Requests During Hospitalization: None        Additional Information 1:1 In Past 12 Months?: No CIRT Risk: No Elopement Risk: No Does patient have medical clearance?: Yes     Disposition: Pending psych consult.  Disposition Initial Assessment Completed for this Encounter: Yes Disposition of Patient: Other dispositions Other disposition(s): Other (Comment) (Pending psych consult )  On Site Evaluation by:   Reviewed with Physician:    Graciella Freer [place] 06/22/2013 6:11 AM

## 2013-06-22 NOTE — ED Notes (Signed)
Pt complains of right arm cramping. Requesting musce relaxer

## 2013-06-22 NOTE — ED Notes (Signed)
Patient change into scrubs to move to psych ED. Security called to wand patient.

## 2013-06-22 NOTE — ED Provider Notes (Signed)
Medical screening examination/treatment/procedure(s) were performed by non-physician practitioner and as supervising physician I was immediately available for consultation/collaboration.   EKG Interpretation None        Mariea Clonts, MD 06/22/13 [phone]

## 2013-06-22 NOTE — Consult Note (Signed)
  Review of Systems  Constitutional: Negative.   HENT: Negative.   Eyes: Negative.   Respiratory: Negative.   Cardiovascular: Negative.   Gastrointestinal: Negative.   Genitourinary: Negative.   Musculoskeletal: Negative.   Skin: Negative.   Neurological: Negative.   Endo/Heme/Allergies: Negative.   Psychiatric/Behavioral: Positive for depression and substance abuse.

## 2013-06-22 NOTE — [facility] Suicide Risk Assessment (Signed)
Suicide Risk Assessment  Discharge Assessment     Demographic Factors:  Low socioeconomic status  Total Time spent with patient: 1 hour  Psychiatric Specialty Exam:     Blood pressure 132/87, pulse 79, temperature 98.1 F (36.7 C), temperature source Oral, resp. rate 18, last menstrual period 05/23/2013, SpO2 98.00%.There is no weight on file to calculate BMI.  General Appearance: Casual  Eye Contact::  Good  Speech:  Clear and Coherent  Volume:  Normal  Mood:  Depressed  Affect:  Appropriate  Thought Process:  Coherent and Logical  Orientation:  Full (Time, Place, and Person)  Thought Content:  Negative  Suicidal Thoughts:  No  Homicidal Thoughts:  No  Memory:  Immediate;   Good Recent;   Good Remote;   Good  Judgement:  Good  Insight:  Fair  Psychomotor Activity:  Normal  Concentration:  Good  Recall:  Good  Fund of Knowledge:Good  Language: Good  Akathisia:  Negative  Handed:  Right  AIMS (if indicated):     Assets:  Communication Skills Desire for Improvement Housing Leisure Time [place] Talents/Skills Transportation  Sleep:       Musculoskeletal: Strength & Muscle Tone: within normal limits Gait & Station: normal Patient leans: N/A   Mental Status Per Nursing Assessment::   On Admission:     Current Mental Status by Physician: NA  Loss Factors: Loss of significant relationship and Financial problems/change in socioeconomic status  Historical Factors: substance abuse  Risk Reduction Factors:   Responsible for children under 1 years of age, Sense of responsibility to family, Living with another person, especially a relative, Positive social support and Positive coping skills or problem solving skills  Continued Clinical Symptoms:  Depression:   Comorbid alcohol abuse/dependence  Cognitive Features That Contribute To Risk:  none    Suicide Risk:  Minimal: No identifiable suicidal ideation.  Patients presenting with  no risk factors but with morbid ruminations; may be classified as minimal risk based on the severity of the depressive symptoms  Discharge Diagnoses:   AXIS I:  Substance Abuse and major depression, recurrent, moderate AXIS II:  Deferred AXIS III:   Past Medical History  Diagnosis Date  . Heroin abuse   . Anxiety and depression   . Tobacco abuse disorder   . Asthmatic bronchitis    AXIS IV:  economic problems and problems related to social environment AXIS V:  61-70 mild symptoms  Plan Of Care/Follow-up recommendations:  Activity:  resume usual activity Diet:  resume usual diet  Is patient on multiple antipsychotic therapies at discharge:  No   Has Patient had three or more failed trials of antipsychotic monotherapy by history:  No  Recommended Plan for Multiple Antipsychotic Therapies: NA    Layten Aiken D 06/22/2013, 2:58 PM

## 2013-06-22 NOTE — BH Assessment (Signed)
Per Dr. Lovena Le, discharge patient to [facility] for substance abuse therapy/counseling. Writer contacted the facility @ [phone] and spoke to [place]. Patient's appointment is scheduled for 06/23/2013 @ 10 am.

## 2013-06-22 NOTE — Consult Note (Signed)
[facility] Face-to-Face Psychiatry Consult   Reason for Consult:  Wants detox  Referring Physician:  ER MD   Christie Ruiz is an 38 y.o. female. Total Time spent with patient: 1 hour  Assessment: AXIS I:  major depression,recurrent, moderate and opioid abuse AXIS II:  Deferred AXIS III:   Past Medical History  Diagnosis Date  . Heroin abuse   . Anxiety and depression   . Tobacco abuse disorder   . Asthmatic bronchitis    AXIS IV:  other psychosocial or environmental problems and substance abuse issues AXIS V:  61-70 mild symptoms  Plan:  No evidence of imminent risk to self or others at present.    Subjective:   Christie Ruiz is a 38 y.o. female patient admitted with needing treatment follow up for depression and requesting detox.  HPI:  Christie Ruiz says she has been anxious and depressed and has been trying to get help at [facility] but her appointments have been canceled for various reasons since her intake in October. She takes 200 mg of Zoloft which helps, she says but is still anxious and depressed.  She lives with her mother and wants to be independent but cannot at this point.  Her daughter is 4 and has significant medical problems and she is close to her daughter.  She has a opioid abuse problem which periodically arises but is not a daily thing.  She denies suicidal ideation. HPI Elements:   Location:  depression. Quality:  enough to drive her to use opioids she say when she would rather not. Severity:  not suicidal but unhappy. Timing:  living with her parents. Duration:  years. Context:  recent use of opiods and her mother insisting she get inpatient help.  Past Psychiatric History: Past Medical History  Diagnosis Date  . Heroin abuse   . Anxiety and depression   . Tobacco abuse disorder   . Asthmatic bronchitis     reports that she has been smoking.  She does not have any smokeless tobacco history on file. She reports that she drinks alcohol. She reports that she uses  illicit drugs. No family history on file. Family History Substance Abuse: Yes, Describe: Family Supports: Yes, List: Living Arrangements: Children;Other relatives Can pt return to current living arrangement?: Yes   Allergies:  No Known Allergies  ACT Assessment Complete:  Yes:    Educational Status    Risk to Self: Risk to self Suicidal Ideation: No Suicidal Intent: No Is patient at risk for suicide?: No Suicidal Plan?: No Access to Means: No What has been your use of drugs/alcohol within the last 12 months?: Heroin Previous Attempts/Gestures: No How many times?: 0 Other Self Harm Risks: NA Triggers for Past Attempts: None known Intentional Self Injurious Behavior: None Family Suicide History: No Recent stressful life event(s): Conflict (Comment);Job Loss;Financial Problems Persecutory voices/beliefs?: No Depression: Yes Depression Symptoms: Despondent;Insomnia;Tearfulness;Isolating;Fatigue;Guilt;Loss of interest in usual pleasures;Feeling worthless/self pity;Feeling angry/irritable Substance abuse history and/or treatment for substance abuse?: Yes Suicide prevention information given to non-admitted patients: Not applicable  Risk to Others: Risk to Others Homicidal Ideation: No Thoughts of Harm to Others: No Current Homicidal Intent: No Current Homicidal Plan: No Access to Homicidal Means: No Identified Victim: NA History of harm to others?: No Assessment of Violence: None Noted Violent Behavior Description: NA Does patient have access to weapons?: No Criminal Charges Pending?: No Does patient have a court date: No  Abuse:    Prior Inpatient Therapy: Prior Inpatient Therapy Prior Inpatient Therapy:  Yes Prior Therapy Dates: 1990 Prior Therapy Facilty/Provider(s): Unable to remember the name of the facility Reason for Treatment: [facility]  Prior Outpatient Therapy: Prior Outpatient Therapy Prior Outpatient Therapy: Yes Prior Therapy Dates: Ongoing Prior Therapy  Facilty/Provider(s): [facility]  Reason for Treatment: Medication Management and Therapy  Additional Information: Additional Information 1:1 In Past 12 Months?: No CIRT Risk: No Elopement Risk: No Does patient have medical clearance?: Yes                  Objective: Blood pressure 132/87, pulse 79, temperature 98.1 F (36.7 C), temperature source Oral, resp. rate 18, last menstrual period 05/23/2013, SpO2 98.00%.There is no weight on file to calculate BMI. Results for orders placed during the hospital encounter of 06/21/13 (from the past 72 hour(s))  CBC     Status: None   Collection Time    06/21/13  8:30 PM      Result Value Ref Range   WBC 4.8  4.0 - 10.5 K/uL   RBC 4.43  3.87 - 5.11 MIL/uL   Hemoglobin 12.1  12.0 - 15.0 g/dL   HCT 37.5  36.0 - 46.0 %   MCV 84.7  78.0 - 100.0 fL   MCH 27.3  26.0 - 34.0 pg   MCHC 32.3  30.0 - 36.0 g/dL   RDW 14.2  11.5 - 15.5 %   Platelets 223  150 - 400 K/uL  COMPREHENSIVE METABOLIC PANEL     Status: Abnormal   Collection Time    06/21/13  8:30 PM      Result Value Ref Range   Sodium 139  137 - 147 mEq/L   Potassium 4.7  3.7 - 5.3 mEq/L   Chloride 102  96 - 112 mEq/L   CO2 27  19 - 32 mEq/L   Glucose, Bld 92  70 - 99 mg/dL   BUN 18  6 - 23 mg/dL   Creatinine, Ser 0.85  0.50 - 1.10 mg/dL   Calcium 9.1  8.4 - 10.5 mg/dL   Total Protein 7.7  6.0 - 8.3 g/dL   Albumin 3.7  3.5 - 5.2 g/dL   AST 133 (*) 0 - 37 U/L   ALT 196 (*) 0 - 35 U/L   Alkaline Phosphatase 77  39 - 117 U/L   Total Bilirubin 0.2 (*) 0.3 - 1.2 mg/dL   GFR calc non Af Amer 86 (*) >90 mL/min   GFR calc Af Amer >90  >90 mL/min   Comment: (NOTE)     The eGFR has been calculated using the CKD EPI equation.     This calculation has not been validated in all clinical situations.     eGFR's persistently <90 mL/min signify possible Chronic Kidney     Disease.  ETHANOL     Status: None   Collection Time    06/21/13  8:30 PM      Result  Value Ref Range   Alcohol, Ethyl (B) <11  0 - 11 mg/dL   Comment:            LOWEST DETECTABLE LIMIT FOR     SERUM ALCOHOL IS 11 mg/dL     FOR MEDICAL PURPOSES ONLY  SALICYLATE LEVEL     Status: Abnormal   Collection Time    06/21/13  8:30 PM      Result Value Ref Range   Salicylate Lvl <6.6 (*) 2.8 - 20.0 mg/dL  URINE RAPID DRUG SCREEN (HOSP PERFORMED)  Status: Abnormal   Collection Time    06/21/13 11:18 PM      Result Value Ref Range   Opiates POSITIVE (*) NONE DETECTED   Cocaine NONE DETECTED  NONE DETECTED   Benzodiazepines POSITIVE (*) NONE DETECTED   Amphetamines NONE DETECTED  NONE DETECTED   Tetrahydrocannabinol NONE DETECTED  NONE DETECTED   Barbiturates NONE DETECTED  NONE DETECTED   Comment:            DRUG SCREEN FOR MEDICAL PURPOSES     ONLY.  IF CONFIRMATION IS NEEDED     FOR ANY PURPOSE, NOTIFY LAB     WITHIN 5 DAYS.                LOWEST DETECTABLE LIMITS     FOR URINE DRUG SCREEN     Drug Class       Cutoff (ng/mL)     Amphetamine      1000     Barbiturate      200     Benzodiazepine   948     Tricyclics       546     Opiates          300     Cocaine          300     THC              50   Labs are reviewed and are pertinent for opiates and benzodiazepines.  Current Facility-Administered Medications  Medication Dose Route Frequency Provider Last Rate Last Dose  . albuterol (PROVENTIL HFA;VENTOLIN HFA) 108 (90 BASE) MCG/ACT inhaler 2 puff  2 puff Inhalation Q6H PRN Ezequiel Essex, MD      . cyclobenzaprine (FLEXERIL) tablet 10 mg  10 mg Oral TID PRN Ezequiel Essex, MD   10 mg at 06/22/13 1151  . diphenhydrAMINE (BENADRYL) tablet 50 mg  50 mg Oral Q6H PRN Ezequiel Essex, MD      . LORazepam (ATIVAN) tablet 0.5 mg  0.5 mg Oral Q4H PRN Ezequiel Essex, MD      . sertraline (ZOLOFT) tablet 200 mg  200 mg Oral Daily Ezequiel Essex, MD   200 mg at 06/22/13 1151  . traZODone (DESYREL) tablet 100 mg  100 mg Oral QHS Ezequiel Essex, MD      . zolpidem  (AMBIEN) tablet 10 mg  10 mg Oral QHS PRN Ezequiel Essex, MD       Current Outpatient Prescriptions  Medication Sig Dispense Refill  . albuterol (PROVENTIL HFA;VENTOLIN HFA) 108 (90 BASE) MCG/ACT inhaler Inhale 2 puffs into the lungs every 6 (six) hours as needed for wheezing or shortness of breath.  1 Inhaler  2  . cyclobenzaprine (FLEXERIL) 10 MG tablet Take 10 mg by mouth 3 (three) times daily as needed for muscle spasms.      . diphenhydrAMINE (BENADRYL) 25 MG tablet Take 50 mg by mouth every 6 (six) hours as needed (sleep).       . LORazepam (ATIVAN) 0.5 MG tablet Take 0.5 mg by mouth every 4 (four) hours as needed for anxiety (anxiety).      . sertraline (ZOLOFT) 100 MG tablet Take 200 mg by mouth daily.       . traZODone (DESYREL) 100 MG tablet Take 100 mg by mouth at bedtime.      Marland Kitchen zolpidem (AMBIEN) 10 MG tablet Take 10 mg by mouth at bedtime as needed for sleep.        Psychiatric  Specialty Exam:     Blood pressure 132/87, pulse 79, temperature 98.1 F (36.7 C), temperature source Oral, resp. rate 18, last menstrual period 05/23/2013, SpO2 98.00%.There is no weight on file to calculate BMI.  General Appearance: Casual  Eye Contact::  Good  Speech:  Clear and Coherent  Volume:  Normal  Mood:  Depressed  Affect:  Congruent  Thought Process:  Coherent and Logical  Orientation:  Full (Time, Place, and Person)  Thought Content:  Negative  Suicidal Thoughts:  No  Homicidal Thoughts:  No  Memory:  Immediate;   Good Recent;   Good Remote;   Good  Judgement:  Good  Insight:  Fair  Psychomotor Activity:  Normal  Concentration:  Good  Recall:  Good  Fund of Knowledge:Good  Language: Good  Akathisia:  Negative  Handed:  Right  AIMS (if indicated):     Assets:  Communication Skills Desire for Improvement Housing Leisure Time [place] Talents/Skills Transportation  Sleep:      Musculoskeletal: Strength & Muscle Tone: within normal limits Gait &  Station: normal Patient leans: N/A  Treatment Plan Summary: discharge home to be followed by [facility] outpatient  Yazmyn Valbuena D 06/22/2013 2:37 PM

## 2014-02-25 ENCOUNTER — Ambulatory Visit (INDEPENDENT_AMBULATORY_CARE_PROVIDER_SITE_OTHER): Payer: Medicaid Other | Admitting: Neurology

## 2014-02-25 ENCOUNTER — Encounter: Payer: Self-pay | Admitting: Neurology

## 2014-02-25 VITALS — BP 100/78 | HR 82 | Resp 16 | Ht 64.5 in | Wt 196.0 lb

## 2014-02-25 DIAGNOSIS — G5603 Carpal tunnel syndrome, bilateral upper limbs: Secondary | ICD-10-CM

## 2014-02-25 DIAGNOSIS — G5602 Carpal tunnel syndrome, left upper limb: Secondary | ICD-10-CM

## 2014-02-25 DIAGNOSIS — R202 Paresthesia of skin: Secondary | ICD-10-CM

## 2014-02-25 DIAGNOSIS — G5601 Carpal tunnel syndrome, right upper limb: Secondary | ICD-10-CM
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
## 2014-02-25 NOTE — Patient Instructions (Signed)
1.  We will obtain electrodiagnostic testing of the both hands 2.  Return to clinic in 6-weeks

## 2014-02-25 NOTE — Progress Notes (Signed)
Note faxed.

## 2014-02-28 ENCOUNTER — Ambulatory Visit (INDEPENDENT_AMBULATORY_CARE_PROVIDER_SITE_OTHER): Payer: Medicaid Other | Admitting: Neurology

## 2014-02-28 DIAGNOSIS — G5601 Carpal tunnel syndrome, right upper limb: Secondary | ICD-10-CM | POA: Diagnosis not present

## 2014-02-28 DIAGNOSIS — G5603 Carpal tunnel syndrome, bilateral upper limbs: Secondary | ICD-10-CM

## 2014-02-28 NOTE — Procedures (Signed)
[facility]  [place], [place]  [place], [place] [address] Tel: [phone] Fax:  [phone] Test Date:  02/28/2014  Patient: Christie Ruiz DOB: Sep 03, 1975 Physician: Narda Amber  Sex: Female Height: 5\' 4"  Ref Phys: Narda Amber  ID#: [id number] Temp: 35.0C Technician: Laureen Ochs R. NCS T.   Patient Complaints: Patient is a 38 year old female here for evaluation of bilateral hand paresthesias, right worse than left.  NCV & EMG Findings: Extensive electrodiagnostic testing of the right upper extremity and additional studies of the left reveals:  1. Right median sensory response is prolonged with preserved amplitude. The left median sensory response is normal, however Palmer responses are abnormal on the left. Bilateral ulnar and radial sensory responses are within normal limits. 2. Right median motor response shows prolonged latency with preserved amplitude. The left median motor response and bilateral ulnar motor responses are within normal limits.  3. There is no evidence of active or chronic motor axon loss changes affecting any of the tested muscles. In particular, motor unit configuration and recruitment pattern is within normal limits.   Impression: Bilateral median neuropathy, at or distal to the wrist consistent with the clinical diagnosis of carpal tunnel syndrome. Overall, these findings are mild in degree electrically and worse in the right.   ___________________________ Narda Amber    Nerve Conduction Studies Anti Sensory Summary Table   Stim Site NR Peak (ms) Norm Peak (ms) P-T Amp (V) Norm P-T Amp  Left Median Anti Sensory (2nd Digit)  35C  Wrist    3.4 <3.4 30.1 >20  Right Median Anti Sensory (2nd Digit)  35C  Wrist    3.6 <3.4 26.6 >20  Left Radial Anti Sensory (Base 1st Digit)  35C  Wrist    2.6 <2.7 23.1 >18  Right Radial Anti Sensory (Base 1st Digit)  35C  Wrist    2.2 <2.7 22.0 >18  Left Ulnar Anti Sensory (5th Digit)  35C    Wrist    2.9 <3.1 31.0 >12  Right Ulnar Anti Sensory (5th Digit)  35C  Wrist    2.8 <3.1 24.7 >12   Motor Summary Table   Stim Site NR Onset (ms) Norm Onset (ms) O-P Amp (mV) Norm O-P Amp Site1 Site2 Delta-0 (ms) Dist (cm) Vel (m/s) Norm Vel (m/s)  Left Median Motor (Abd Poll Brev)  35C  Wrist    3.7 <3.9 8.3 >6 Elbow Wrist 4.7 27.0 57 >50  Elbow    8.4  8.3         Right Median Motor (Abd Poll Brev)  35C  Wrist    4.2 <3.9 8.7 >6 Elbow Wrist 4.8 26.5 55 >50  Elbow    9.0  8.3         Left Ulnar Motor (Abd Dig Minimi)  35C  Wrist    2.0 <3.1 8.1 >7 B Elbow Wrist 4.2 23.0 55 >50  B Elbow    6.2  7.9  A Elbow B Elbow 2.0 10.0 50 >50  A Elbow    8.2  7.7         Right Ulnar Motor (Abd Dig Minimi)  35C  Wrist    2.7 <3.1 9.5 >7 B Elbow Wrist 3.6 21.5 60 >50  B Elbow    6.3  9.2  A Elbow B Elbow 1.6 10.0 62 >50  A Elbow    7.9  8.9          Comparison Summary Table  Stim Site NR Peak (ms) Norm Peak (ms) P-T Amp (V) Site1 Site2 Delta-P (ms) Norm Delta (ms)  Left Median/Ulnar Palm Comparison (Wrist - 8cm)  35C  Median Palm    2.1 <2.2 55.0 Median Palm Ulnar Palm 0.4   Ulnar Palm    1.7 <2.2 23.8       EMG   Side Muscle Ins Act Fibs Psw Fasc Number Recrt Dur Dur. Amp Amp. Poly Poly. Comment  Right Deltoid Nml Nml Nml Nml Nml Nml Nml Nml Nml Nml Nml Nml N/A  Right 1stDorInt Nml Nml Nml Nml Nml Nml Nml Nml Nml Nml Nml Nml N/A  Right Abd Poll Brev Nml Nml Nml Nml Nml Nml Nml Nml Nml Nml Nml Nml N/A  Right PronatorTeres Nml Nml Nml Nml Nml Nml Nml Nml Nml Nml Nml Nml N/A  Right Biceps Nml Nml Nml Nml Nml Nml Nml Nml Nml Nml Nml Nml N/A  Right Triceps Nml Nml Nml Nml Nml Nml Nml Nml Nml Nml Nml Nml N/A  Left 1stDorInt Nml Nml Nml Nml Nml Nml Nml Nml Nml Nml Nml Nml N/A  Left Abd Poll Brev Nml Nml Nml Nml Nml Nml Nml Nml Nml Nml Nml Nml N/A  Left PronatorTeres Nml Nml Nml Nml Nml Nml Nml Nml Nml Nml Nml Nml N/A  Left Biceps Nml Nml Nml Nml Nml Nml Nml Nml Nml Nml Nml Nml N/A   Left Triceps Nml Nml Nml Nml Nml Nml Nml Nml Nml Nml Nml Nml N/A  Left Deltoid Nml Nml Nml Nml Nml Nml Nml Nml Nml Nml Nml Nml N/A      Waveforms:

## 2014-02-28 NOTE — Patient Instructions (Signed)
After an EMG, you may be sore and have a tingling feeling in your muscles for several hours following the testing. You may have small bruises or swelling at the needle site.  If so, apply ice or take Tylenol for the discomfort.   

## 2014-03-22 IMAGING — CR DG CHEST 2V
2 series · 2 of 2 positions shown · non-contrast
Comparison: None.

CLINICAL DATA: Cough

EXAM:
CHEST  2 VIEW

[view not recorded (1 of 2)]
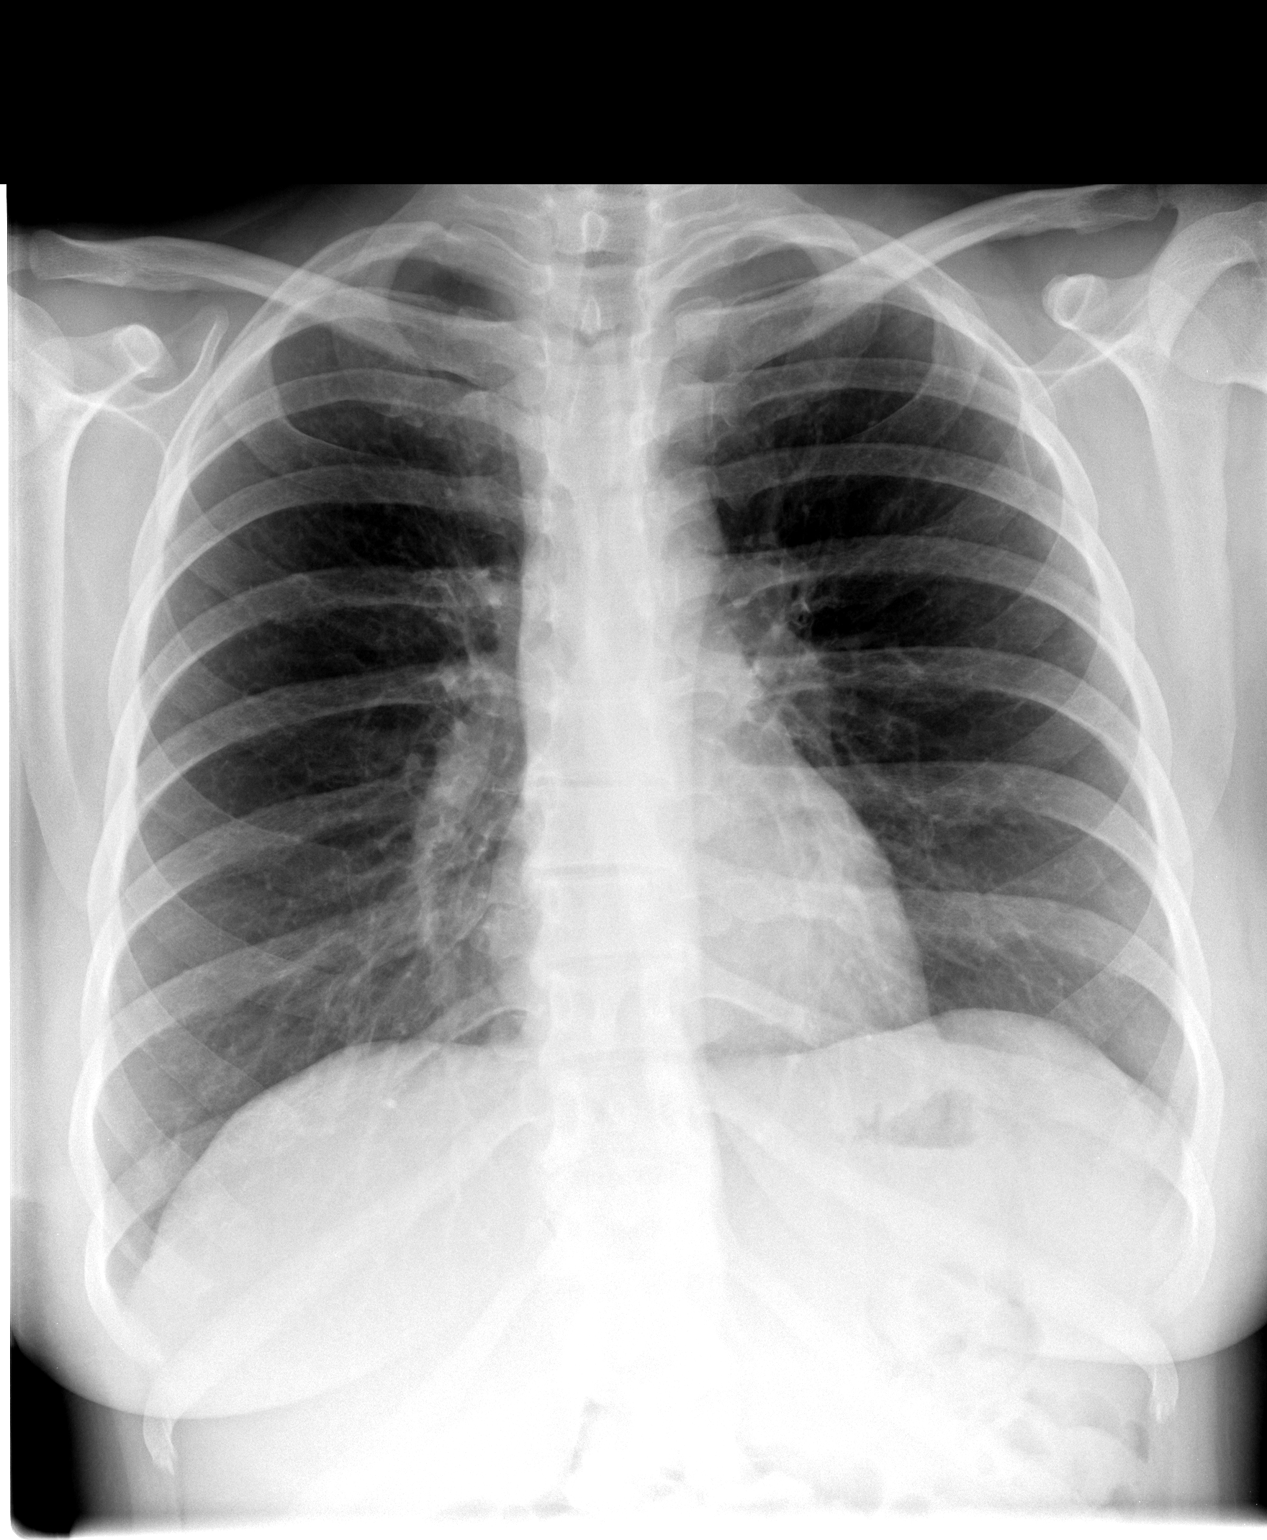

[view not recorded (2 of 2)]
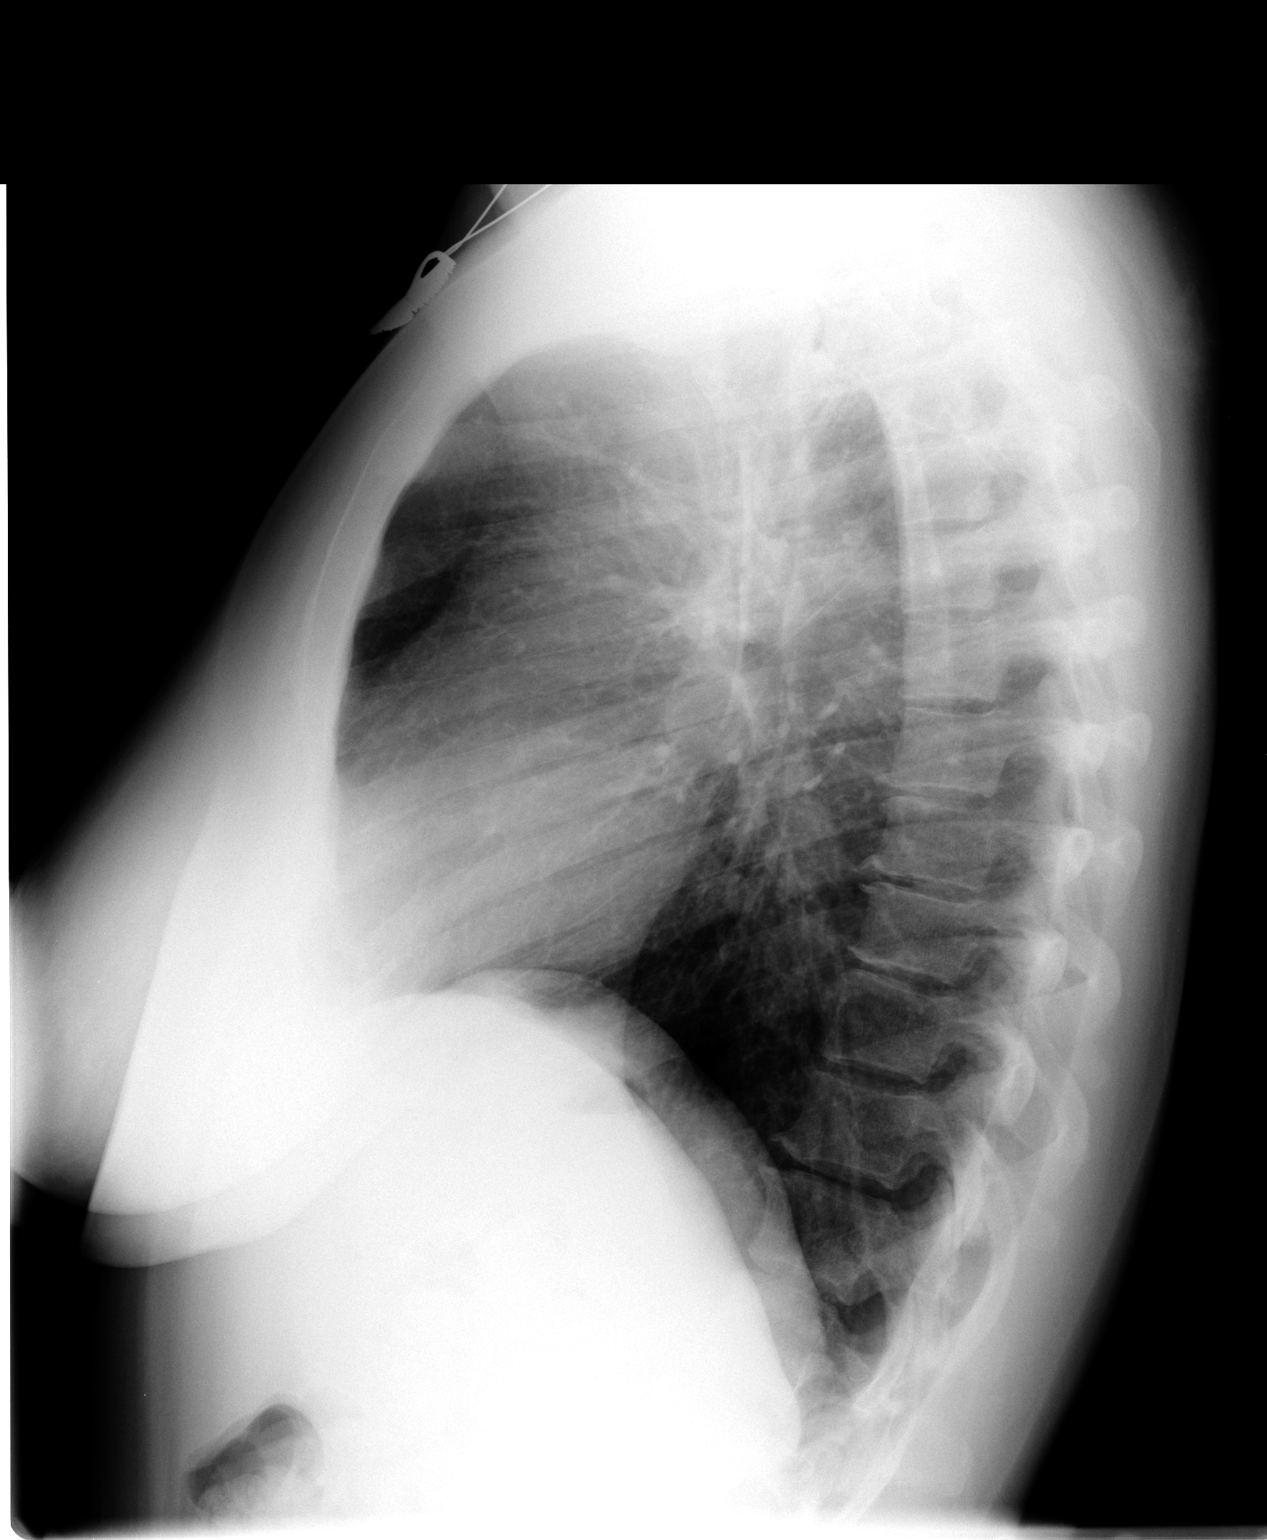

[2 of 2 positions shown; findings below may reference images not displayed]

FINDINGS: Normal heart size. Clear lungs. Bronchitic changes. No pleural
effusion. Mild hyperaeration.
IMPRESSION: Mild hyperaeration.  Otherwise, no active cardiopulmonary disease.

## 2014-04-07 ENCOUNTER — Encounter: Payer: Self-pay | Admitting: Neurology

## 2014-04-12 ENCOUNTER — Ambulatory Visit: Payer: Self-pay | Admitting: Neurology

## 2014-04-24 IMAGING — US US ABDOMEN LIMITED
1 series · 14 of 25 positions shown · non-contrast
Comparison: None.

CLINICAL DATA: Hepatitis-C.  Rule out hepatoma

EXAM:
US ABDOMEN LIMITED - RIGHT UPPER QUADRANT

[Series 1: us abdomen limited · 0.28mm/px · 14 of 56 slices shown]
[im 1/56]
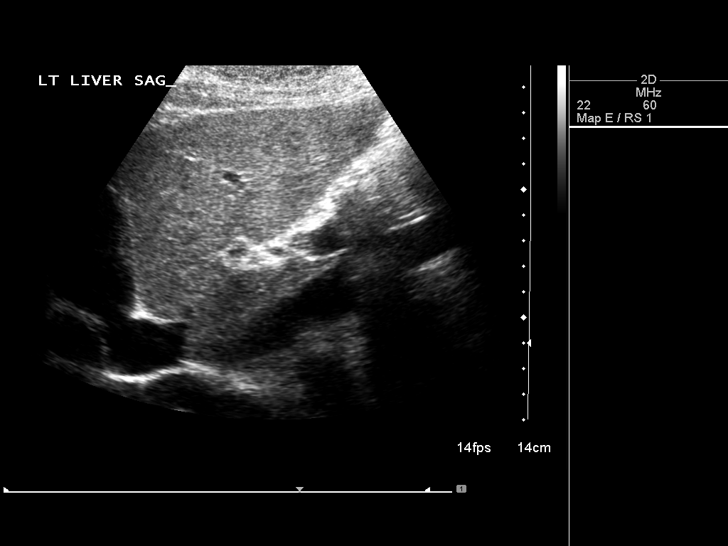
[im 5/56]
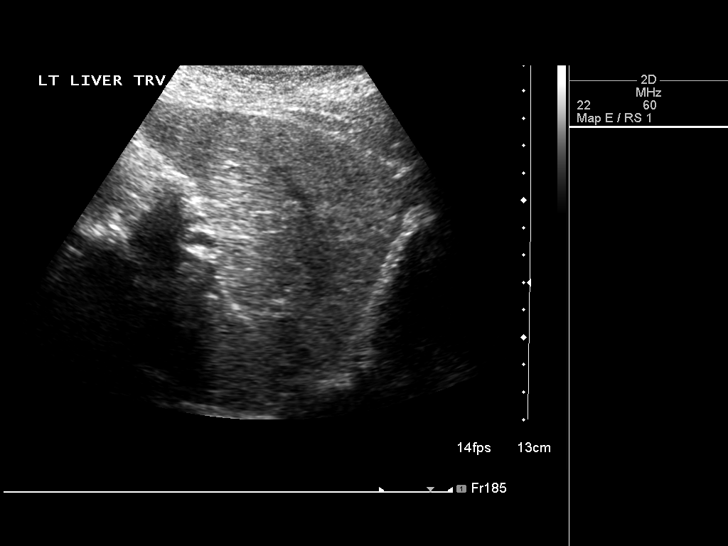
[im 10/56]
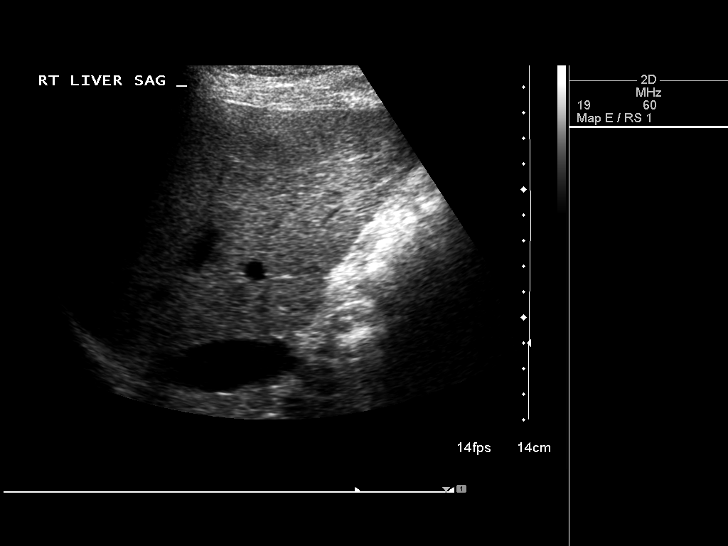
[im 14/56]
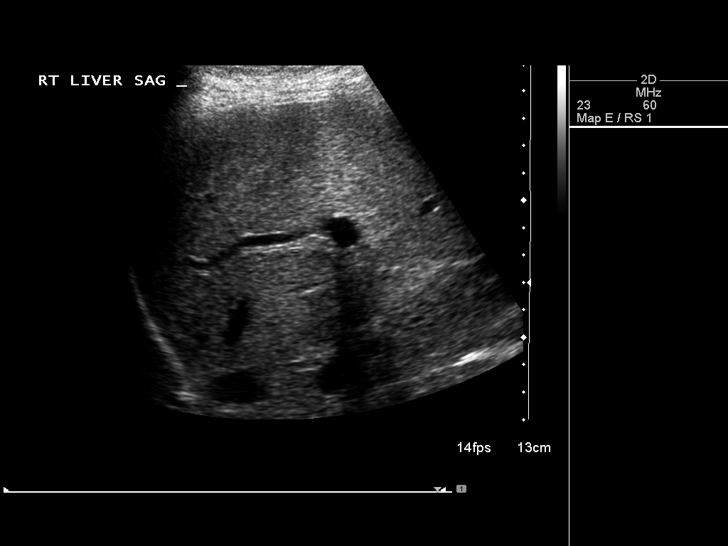
[im 19/56]
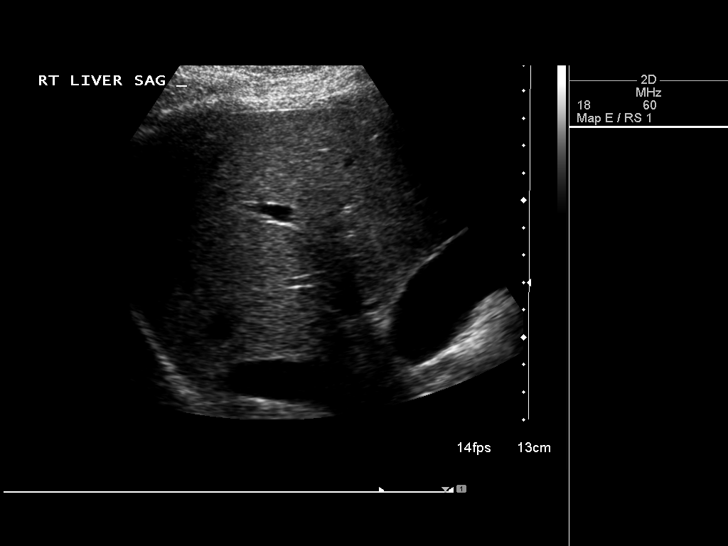
[im 21/56]
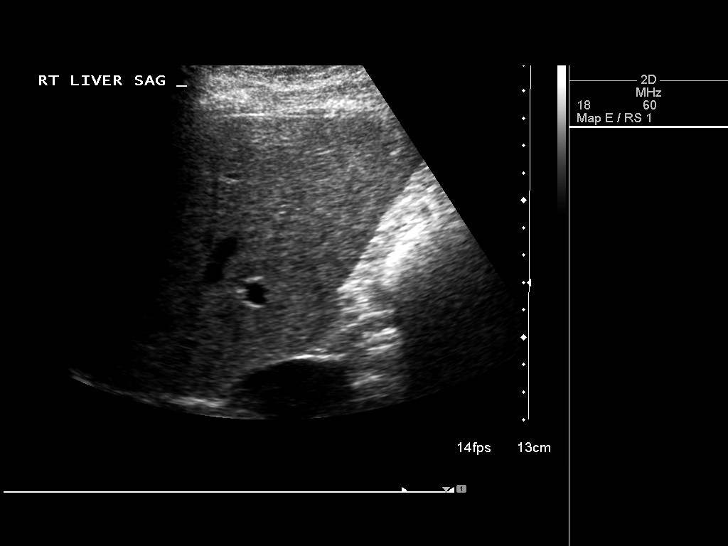
[im 26/56]
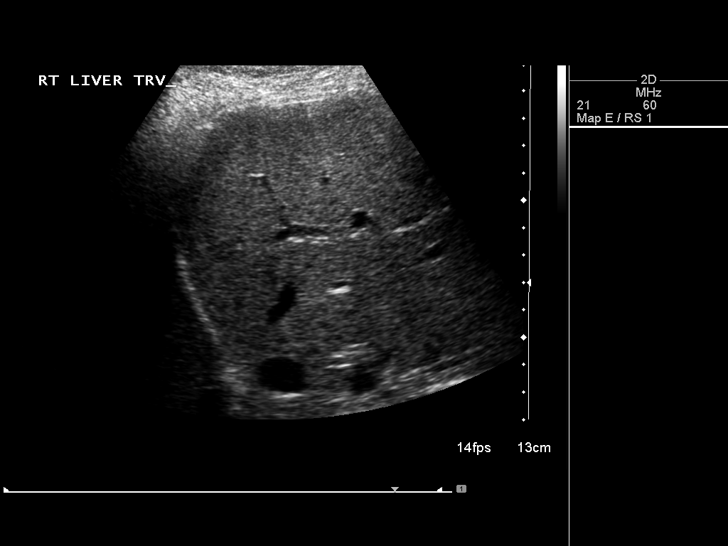
[im 30/56]
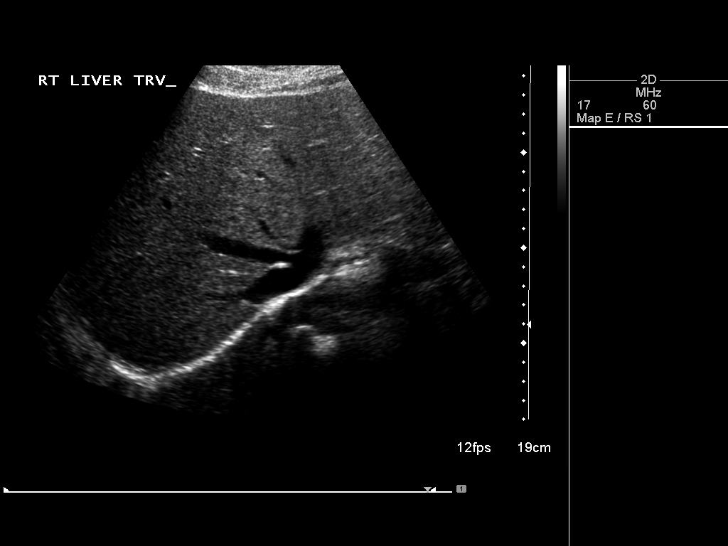
[im 35/56]
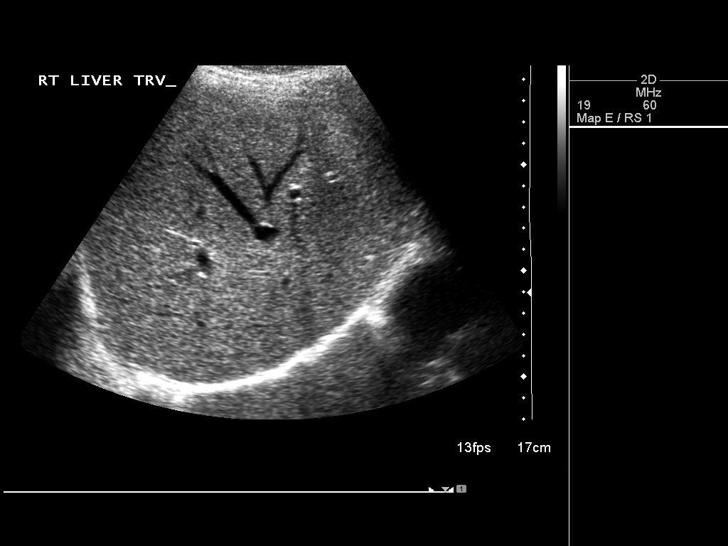
[im 37/56]
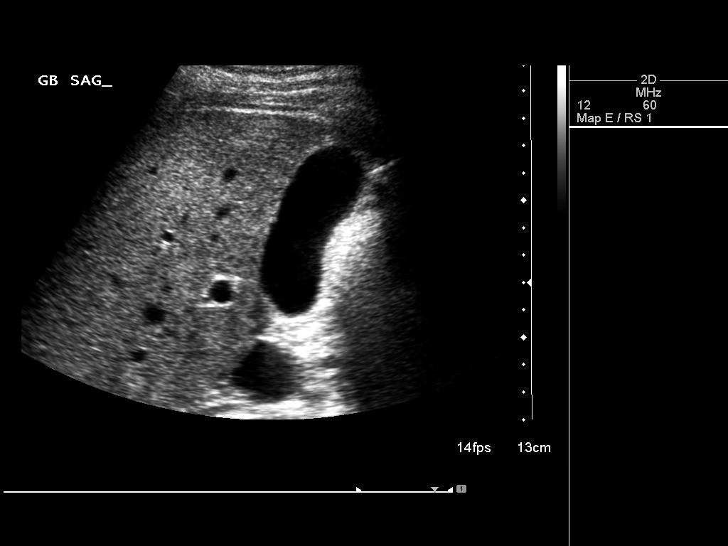
[im 42/56]
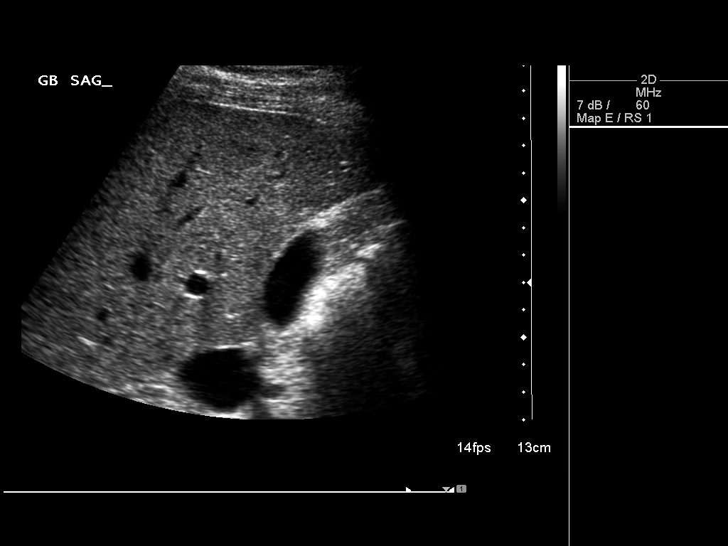
[im 46/56]
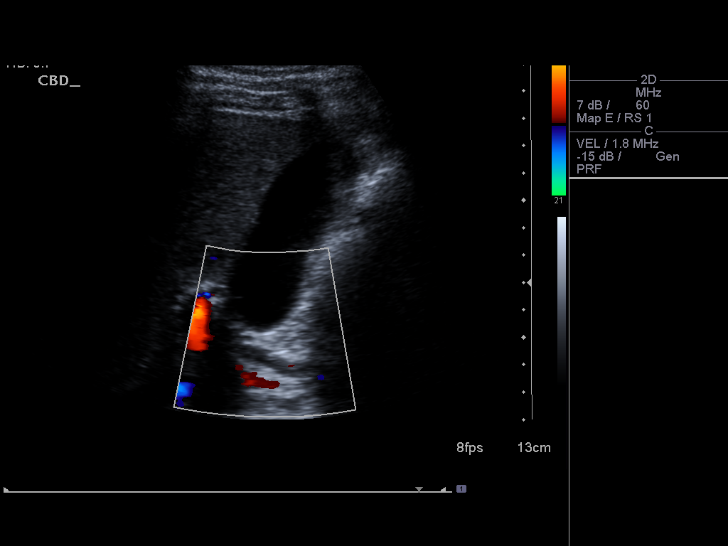
[im 51/56]
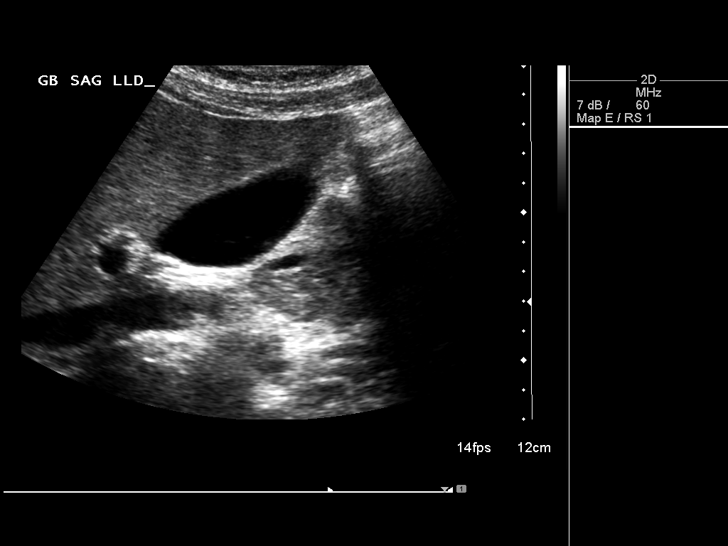
[im 56/56]
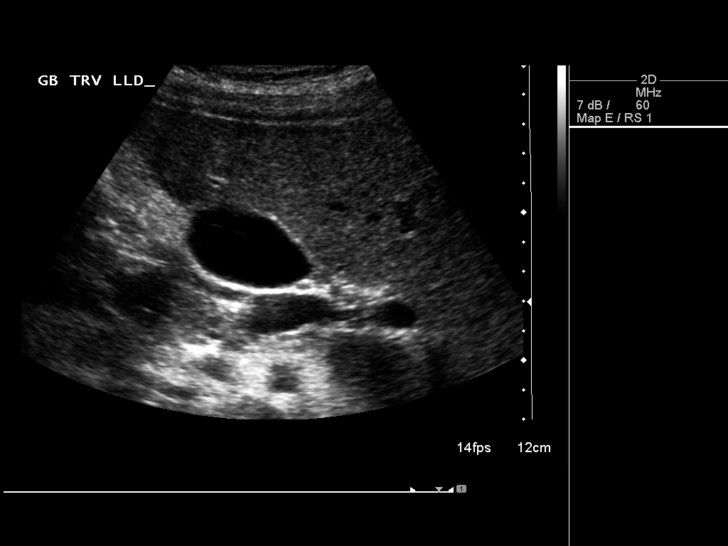

[14 of 25 positions shown; findings below may reference images not displayed]

FINDINGS: Gallbladder:

No gallstones or wall thickening visualized. No sonographic Murphy
sign noted.

Common bile duct:

Diameter: 4.9 mm

Liver:

No focal lesion identified. Within normal limits in parenchymal
echogenicity.
IMPRESSION: Normal

## 2014-06-01 ENCOUNTER — Ambulatory Visit: Payer: Self-pay | Admitting: Neurology

## 2014-06-08 ENCOUNTER — Telehealth: Payer: Self-pay | Admitting: Neurology

## 2014-06-08 NOTE — Telephone Encounter (Signed)
Pt no showed 06/01/14 appt w/ Dr. Posey Pronto. No show letter + Medicaid no show policy mailed to pt / Sherri S.

## 2014-06-09 ENCOUNTER — Encounter: Payer: Self-pay | Admitting: *Deleted

## 2014-11-10 ENCOUNTER — Ambulatory Visit: Payer: Self-pay | Admitting: Neurology

## 2015-05-19 MED FILL — SUBOXONE 4 MG-1 MG SL FILM: 4-1 | 7 days supply | Qty: 14 | Fill #0

## 2015-05-25 MED FILL — ZUBSOLV 5.7-1.4 MG TAB SL: 5.7-1.4 | 7 days supply | Qty: 14 | Fill #0

## 2015-06-01 MED FILL — ZUBSOLV 5.7-1.4 MG TAB SL: 5.7-1.4 | 7 days supply | Qty: 21 | Fill #0

## 2015-06-08 MED FILL — ZUBSOLV 5.7-1.4 MG TAB SL: 5.7-1.4 | 7 days supply | Qty: 21 | Fill #0

## 2015-06-15 MED FILL — ZUBSOLV 5.7-1.4 MG TAB SL: 5.7-1.4 | 14 days supply | Qty: 42 | Fill #0

## 2015-06-29 MED FILL — ZUBSOLV 5.7-1.4 MG TAB SL: 5.7-1.4 | 14 days supply | Qty: 42 | Fill #0

## 2015-07-13 MED FILL — ZUBSOLV 5.7-1.4 MG TAB SL: 5.7-1.4 | 28 days supply | Qty: 84 | Fill #0

## 2015-08-08 MED FILL — ZUBSOLV 5.7-1.4 MG TAB SL: 5.7-1.4 | 28 days supply | Qty: 84 | Fill #0

## 2015-09-07 MED FILL — ZUBSOLV 5.7-1.4 MG TAB SL: 5.7-1.4 | 28 days supply | Qty: 84 | Fill #0

## 2015-10-05 MED FILL — ZUBSOLV 5.7-1.4 MG TAB SL: 5.7-1.4 | 28 days supply | Qty: 84 | Fill #0

## 2015-11-30 MED FILL — ZUBSOLV 5.7-1.4 MG TAB SL: 5.7-1.4 | 28 days supply | Qty: 84 | Fill #0

## 2015-12-28 MED FILL — ZUBSOLV 5.7-1.4 MG TAB SL: 5.7-1.4 | 14 days supply | Qty: 42 | Fill #0

## 2016-01-11 MED FILL — ZUBSOLV 5.7-1.4 MG TAB SL: 5.7-1.4 | 14 days supply | Qty: 42 | Fill #1

## 2016-01-25 MED FILL — SUBOXONE 8 MG-2 MG SL FILM: 8-2 | 28 days supply | Qty: 84 | Fill #0

## 2016-02-09 MED FILL — ZUBSOLV 5.7-1.4 MG TAB SL: 5.7-1.4 | 28 days supply | Qty: 84 | Fill #0

## 2016-03-22 DEATH — deceased
